# Patient Record
Sex: Male | Born: 1960 | Race: White | Hispanic: No | Smoking: Never smoker
Health system: Southern US, Community
[De-identification: ages and names within clinical notes are randomized; demographics above are authoritative.]

## PROBLEM LIST (undated history)

## (undated) DIAGNOSIS — E7800 Pure hypercholesterolemia, unspecified: Secondary | ICD-10-CM

## (undated) DIAGNOSIS — N200 Calculus of kidney: Secondary | ICD-10-CM

## (undated) DIAGNOSIS — Z973 Presence of spectacles and contact lenses: Secondary | ICD-10-CM

## (undated) DIAGNOSIS — Z87442 Personal history of urinary calculi: Secondary | ICD-10-CM

## (undated) HISTORY — PX: CLEFT PALATE REPAIR: SUR1165

## (undated) HISTORY — PX: MENISCUS REPAIR: SHX5179

## (undated) HISTORY — PX: ORCHIECTOMY: SHX2116

---

## 2010-05-10 DIAGNOSIS — N200 Calculus of kidney: Secondary | ICD-10-CM

## 2010-05-10 HISTORY — DX: Calculus of kidney: N20.0

## 2010-06-30 ENCOUNTER — Ambulatory Visit: Payer: Self-pay | Admitting: Internal Medicine

## 2010-12-17 ENCOUNTER — Ambulatory Visit: Payer: Self-pay

## 2010-12-24 ENCOUNTER — Ambulatory Visit: Payer: Self-pay | Admitting: Urology

## 2010-12-31 ENCOUNTER — Ambulatory Visit: Payer: Self-pay | Admitting: Urology

## 2011-01-14 ENCOUNTER — Ambulatory Visit: Payer: Self-pay | Admitting: Urology

## 2011-01-28 ENCOUNTER — Ambulatory Visit: Payer: Self-pay | Admitting: Urology

## 2011-03-02 ENCOUNTER — Ambulatory Visit: Payer: Self-pay | Admitting: Urology

## 2013-01-02 IMAGING — CR DG ABDOMEN 2V
1 series · 3 of 3 positions shown · non-contrast
Comparison: none

REASON FOR EXAM: pain
COMMENTS:

[Series 1: view not recorded · 0.17mm/px · 3 of 3 slices shown]
[im 1/3]
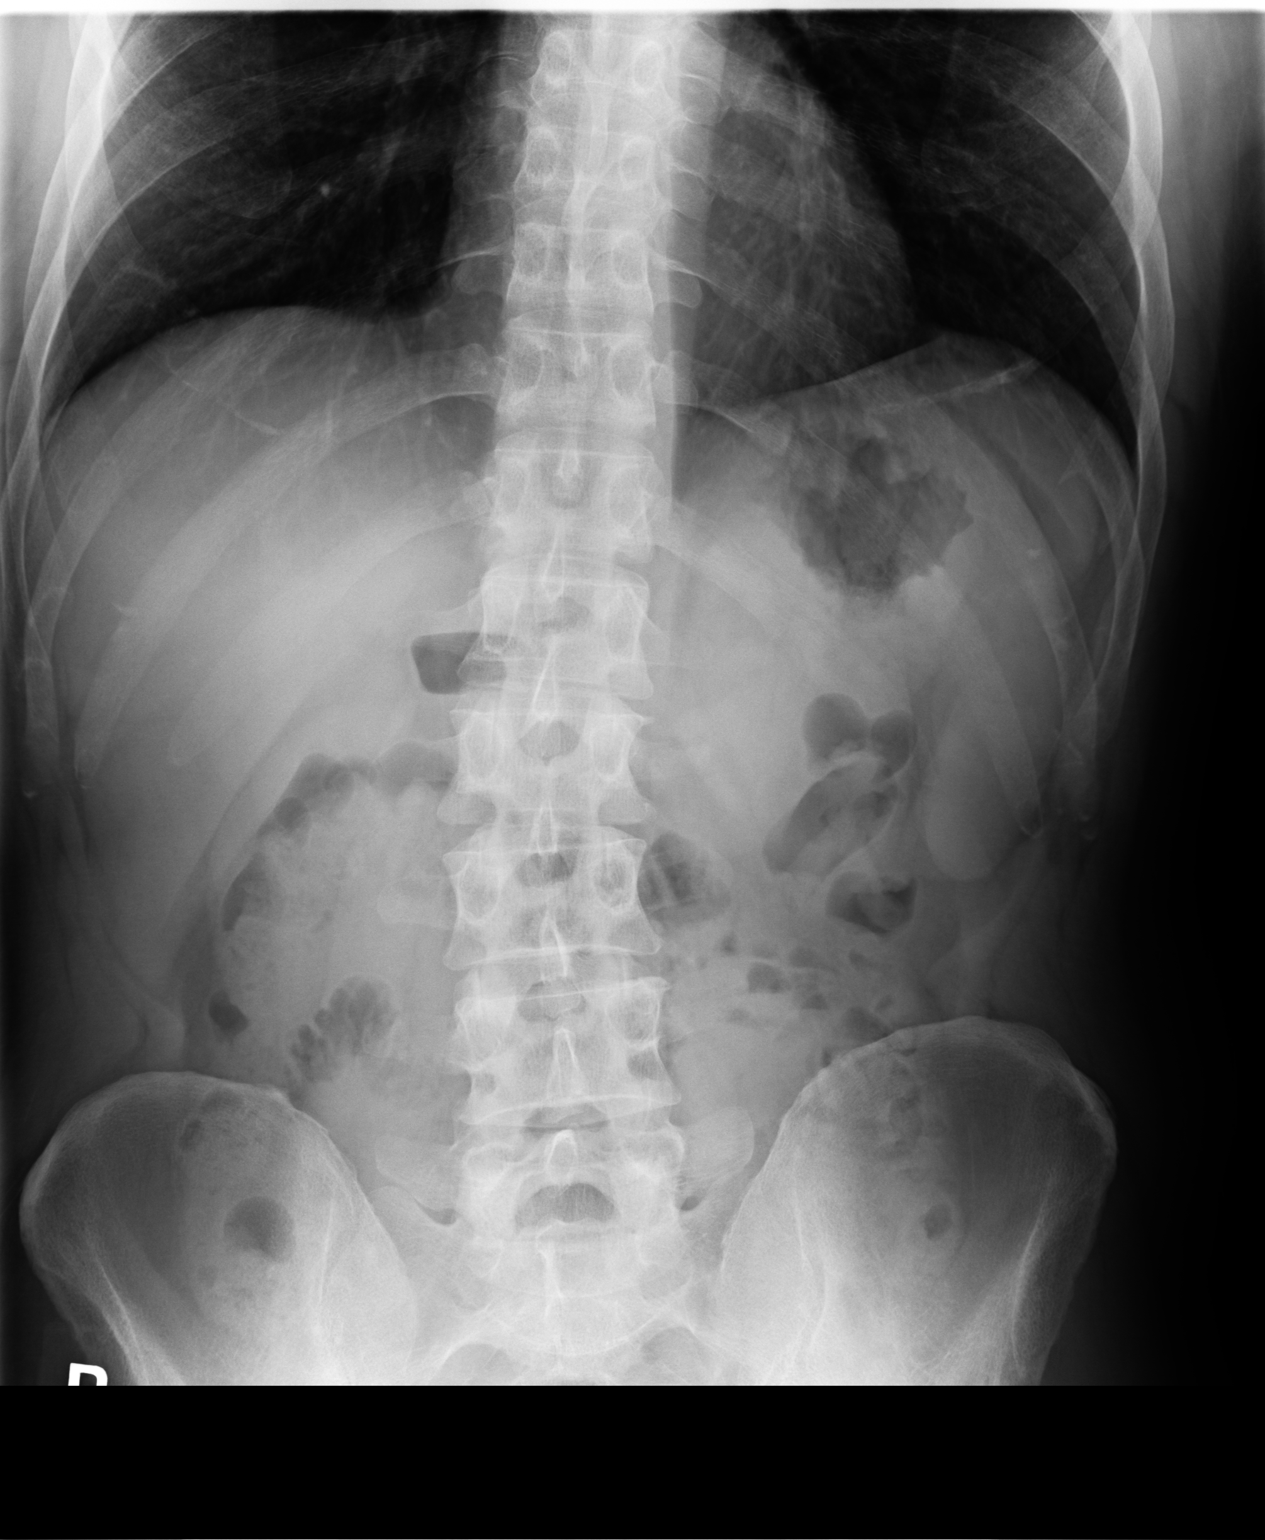
[im 2/3]
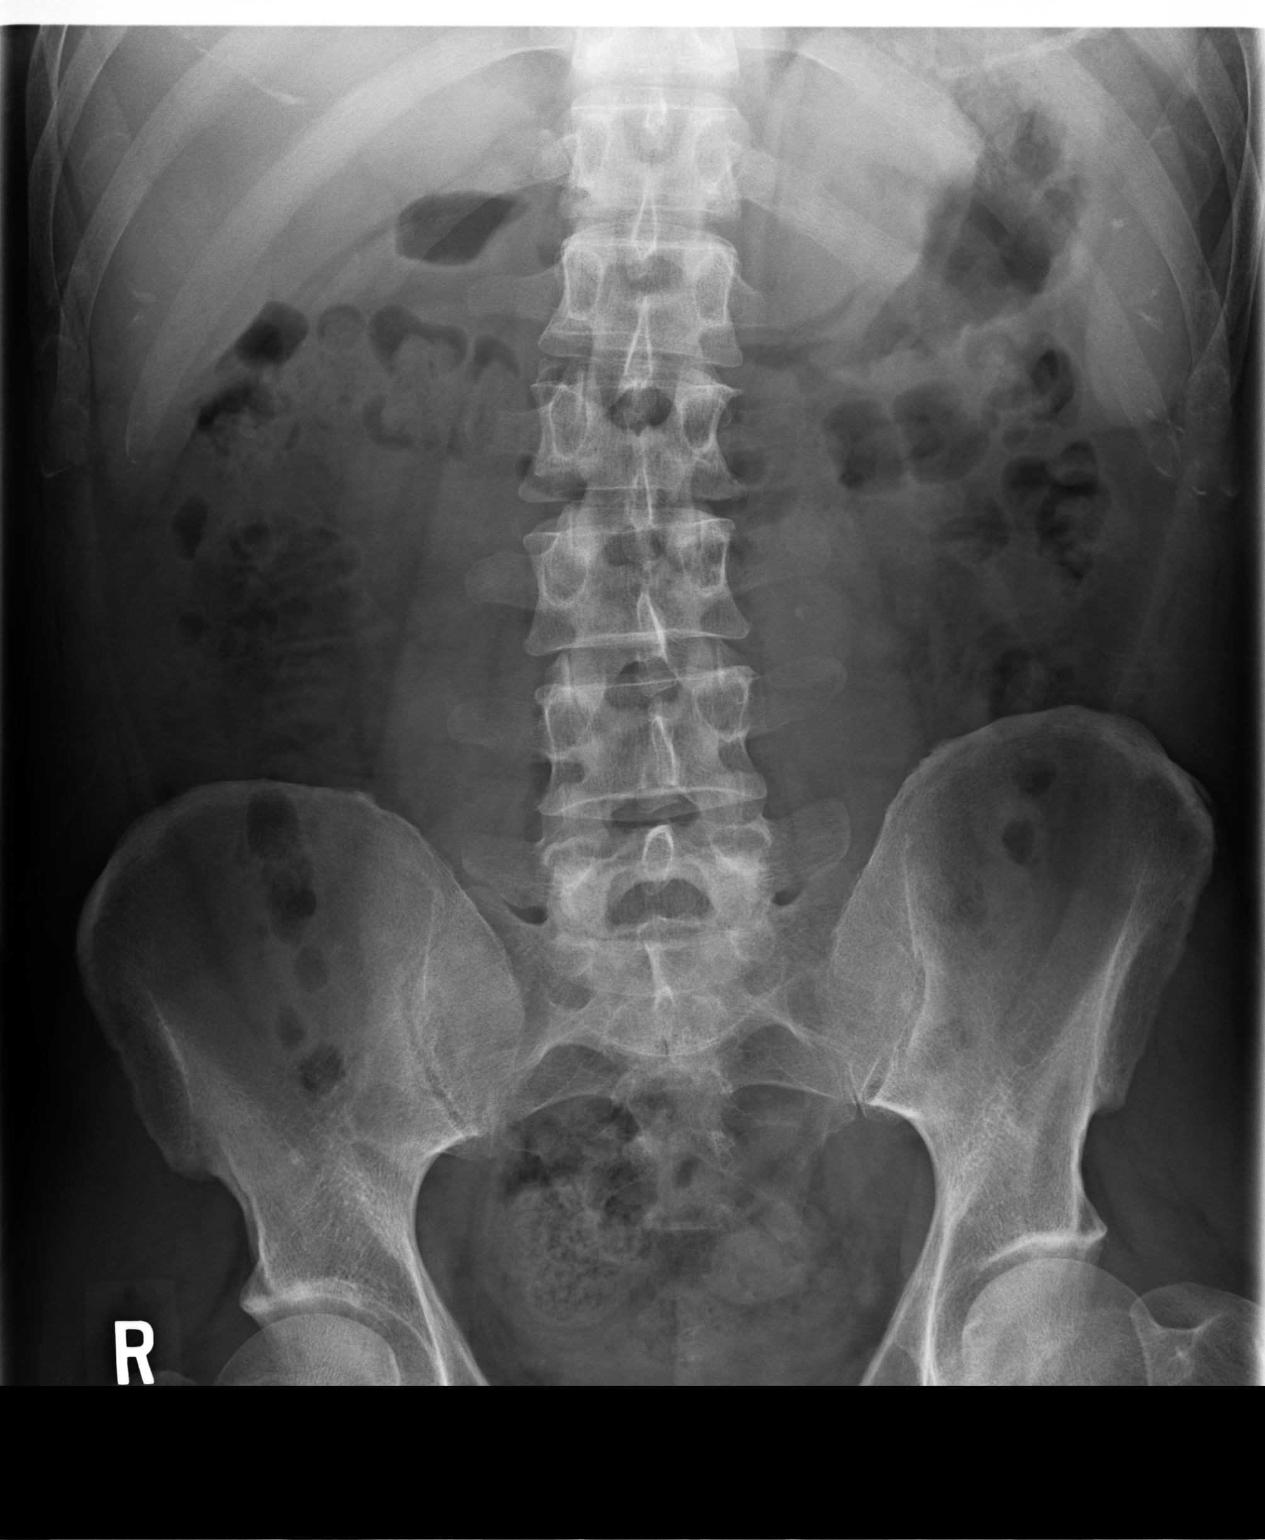
[im 3/3]
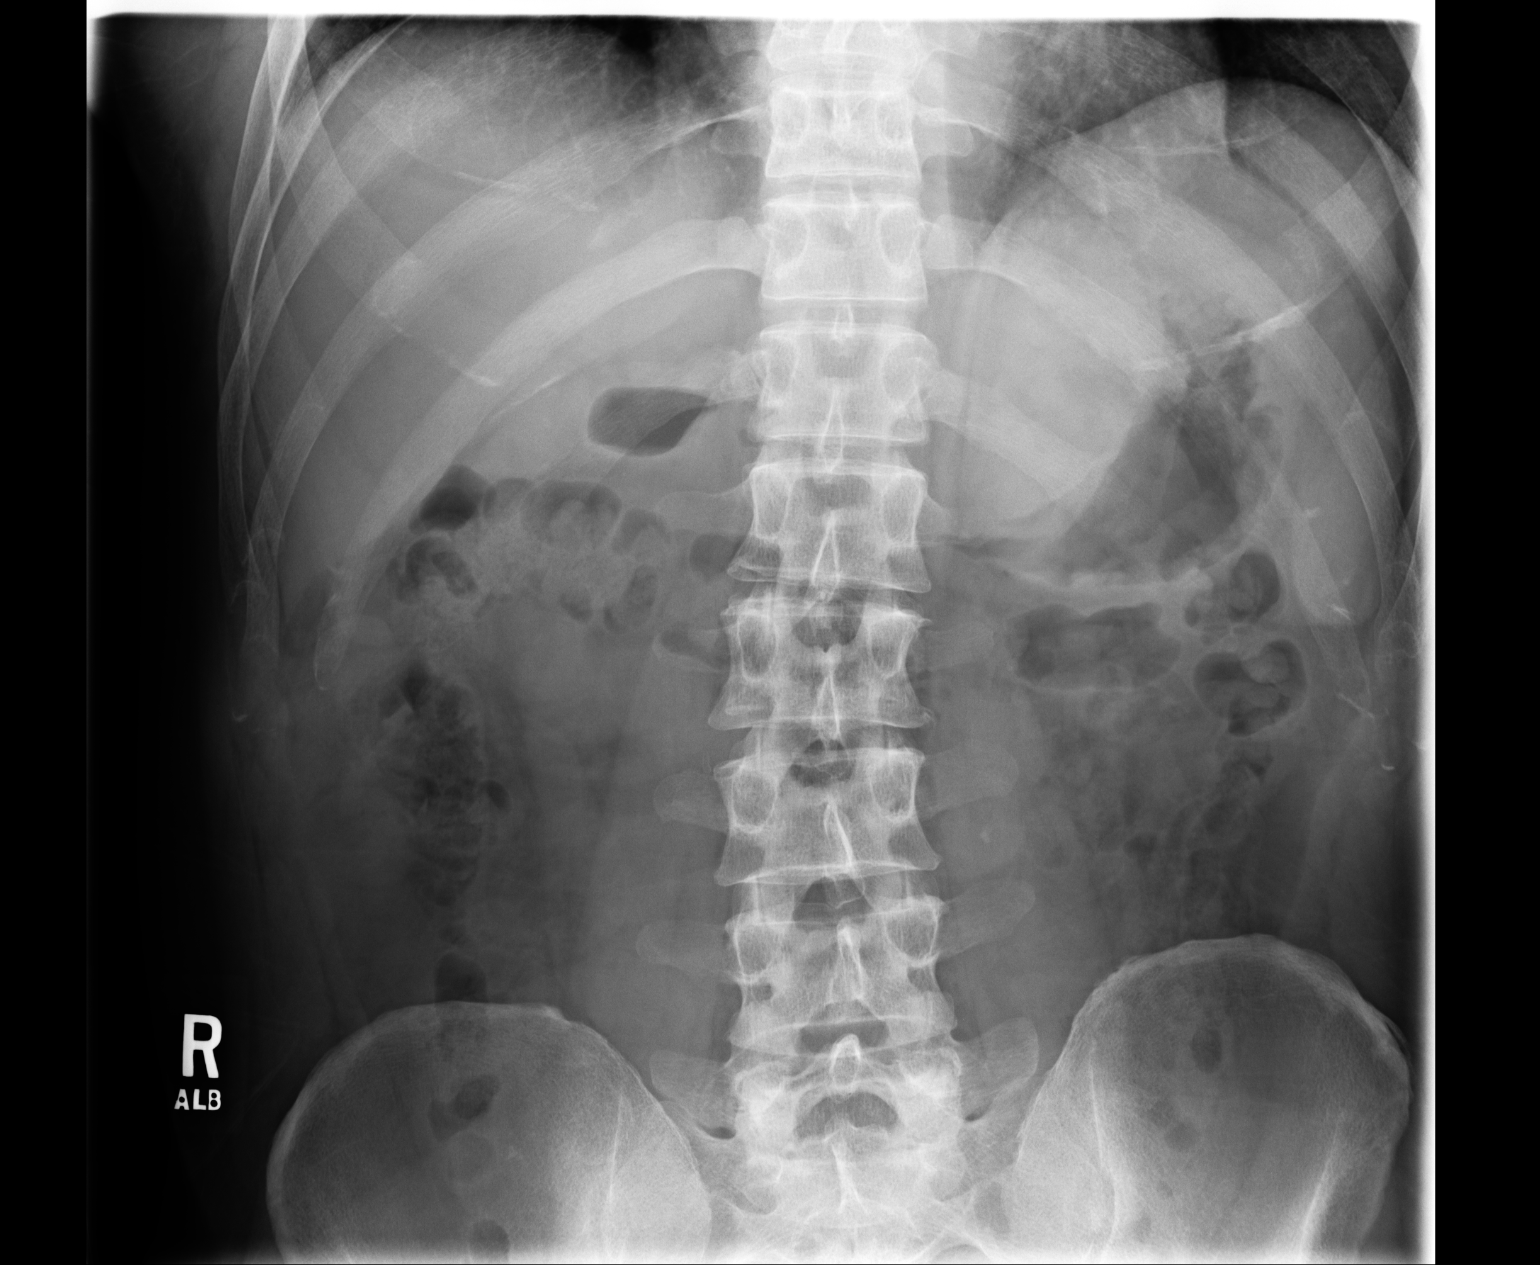

[3 of 3 positions shown; findings below may reference images not displayed]

PROCEDURE:     MDR - MDR ABDOMEN 2V FLAT AND ERECT  - December 17, 2010 [DATE]

RESULT:     The bowel gas pattern may indicate constipation. I do not see
evidence of ileus or obstruction. There is a faint radiodensity that
projects between the left transverse processes of L3 and L4 which may well
reflect a proximal left. There is calcification projecting of the left
aspect of the prostate gland. There is gentle curvature of the lumbar spine
with the convexity toward the right.
IMPRESSION: 1. There is a radiodensity measuring approximately 3 mm in greatest
dimension that projects along the proximal left ureter. This may reflect a
stone.
2. The bowel gas pattern may reflect constipation.

## 2013-01-09 IMAGING — CR DG ABDOMEN 1V
1 series · 2 of 2 positions shown · non-contrast
Comparison: none

REASON FOR EXAM: kidney stone
COMMENTS:

[Series 1: view not recorded · 0.17mm/px · 2 of 2 slices shown]
[im 1/2]
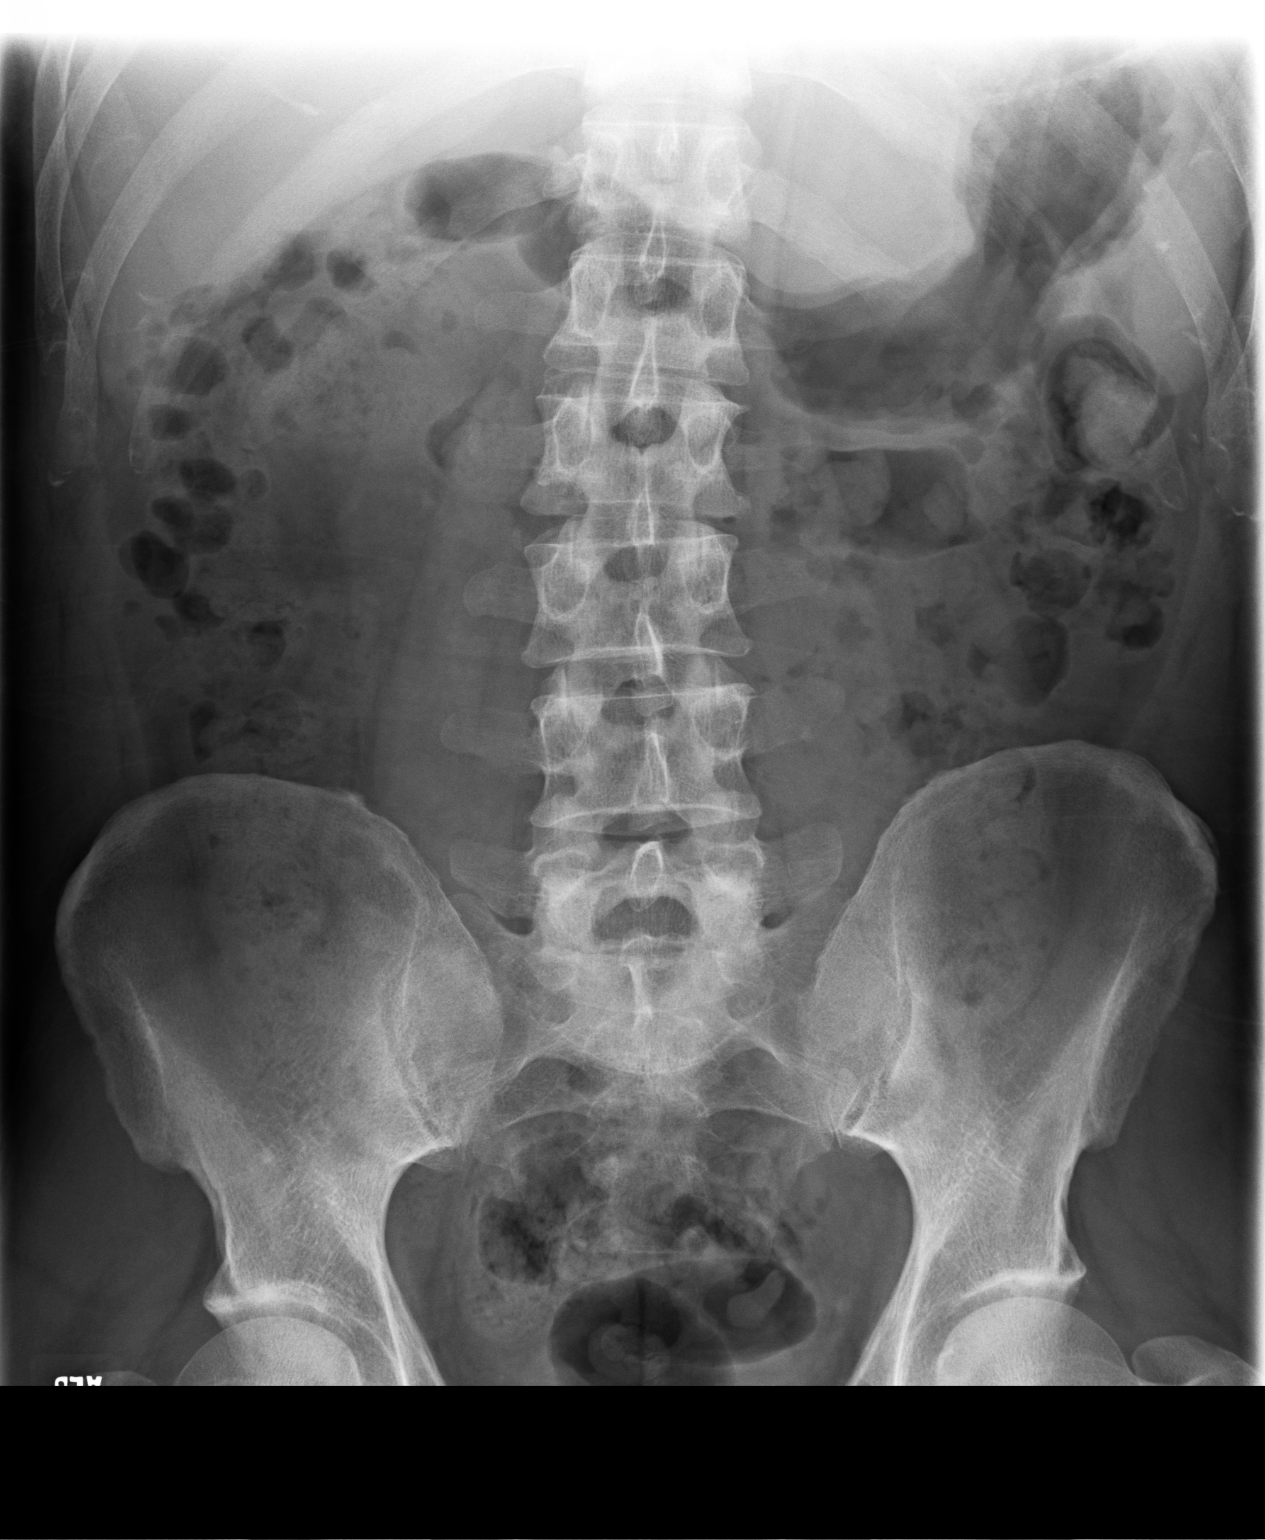
[im 2/2]
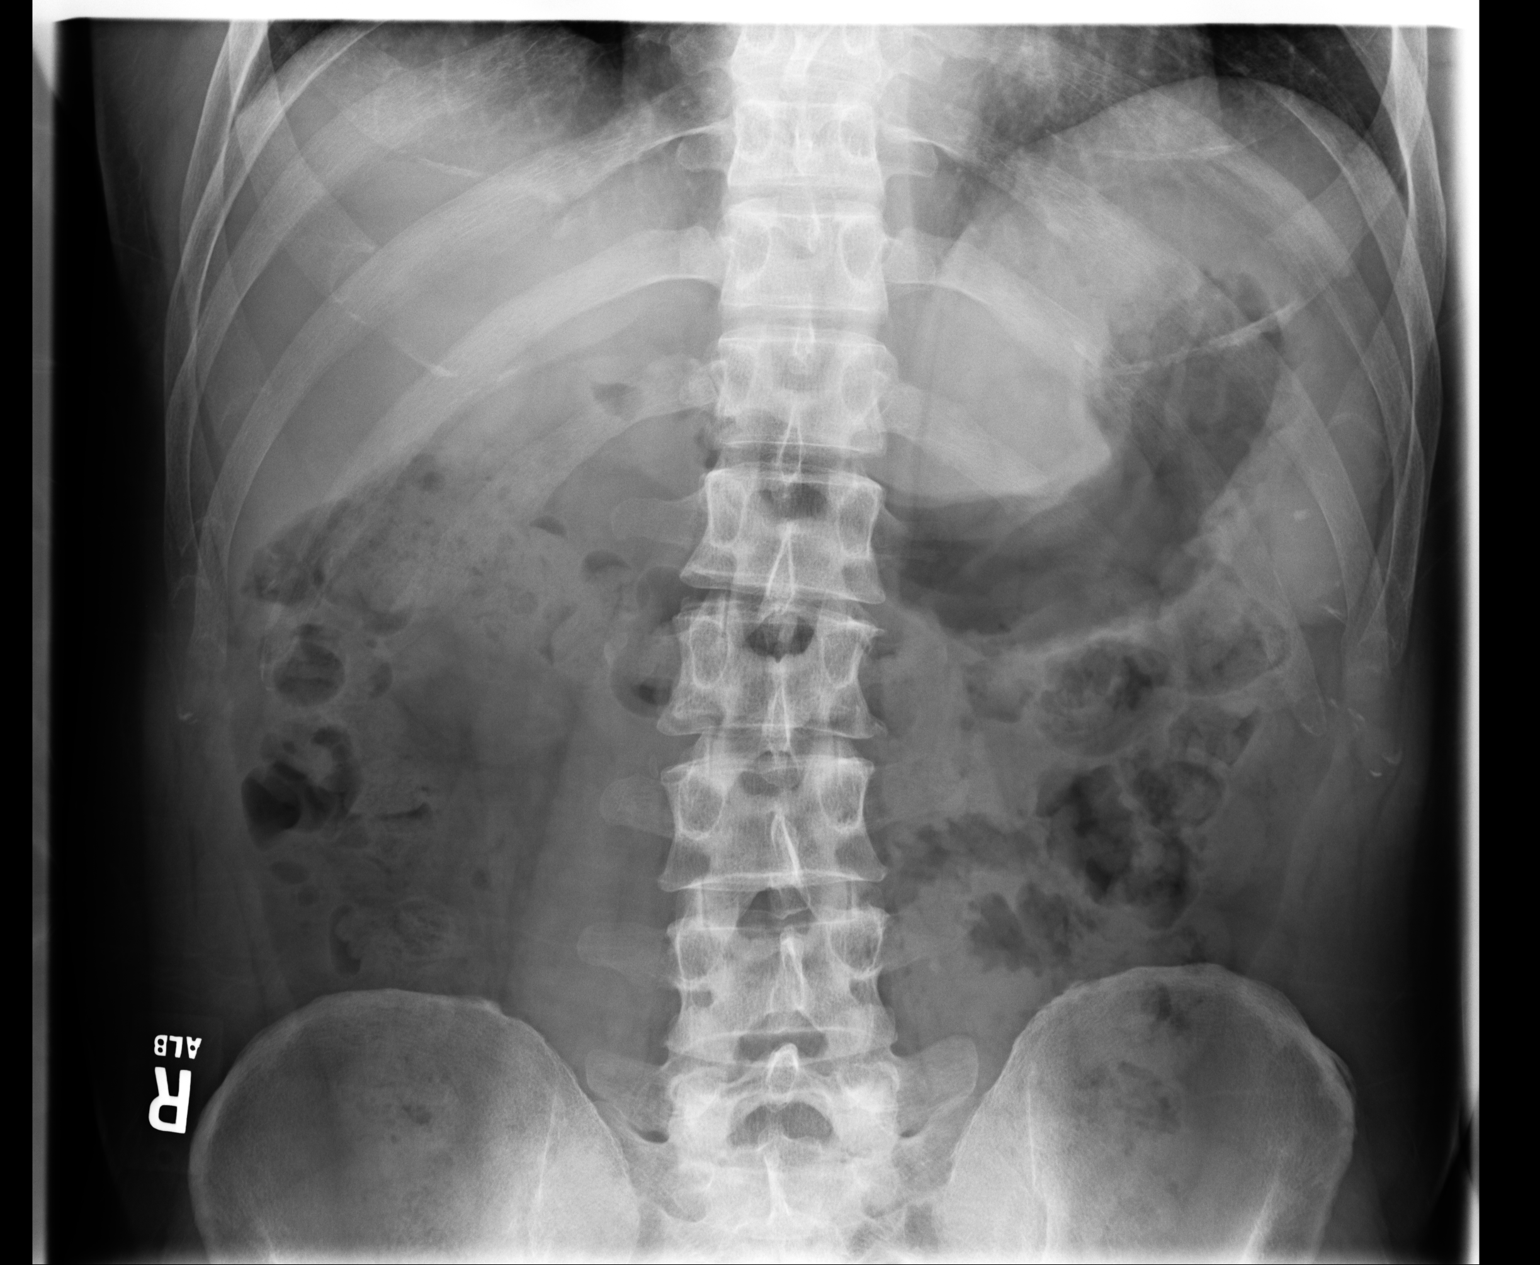

[2 of 2 positions shown; findings below may reference images not displayed]

PROCEDURE:     MDR - MDR KIDNEY URETER BLADDER  - December 24, 2010  [DATE]

RESULT:     Comparison is made to a prior exam of 12/17/2010.

The previously noted calcification inferior to the L3 lumbar transverse
process on the left is now projected over the inferior portion of the L4
lumbar spine transverse process. The finding is consistent with a left
ureteral stone that has moved inferiorly by a few centimeters. Incidental
note is made of a few calcifications in the region of the prostate gland. No
right renal or right ureteral calcifications are seen.
IMPRESSION: 1.  There persist changes consistent with a left ureteral stone that has now
moved inferiorly by a few centimeters.
2.  There is minimal calcification in the region of the prostate gland.

## 2013-01-16 IMAGING — CR DG ABDOMEN 1V
1 series · 2 of 2 positions shown · non-contrast
Comparison: none

REASON FOR EXAM: kidney stones - Send films with patient
COMMENTS:

[Series 1: view not recorded · 0.17mm/px · 2 of 2 slices shown]
[im 1/2]
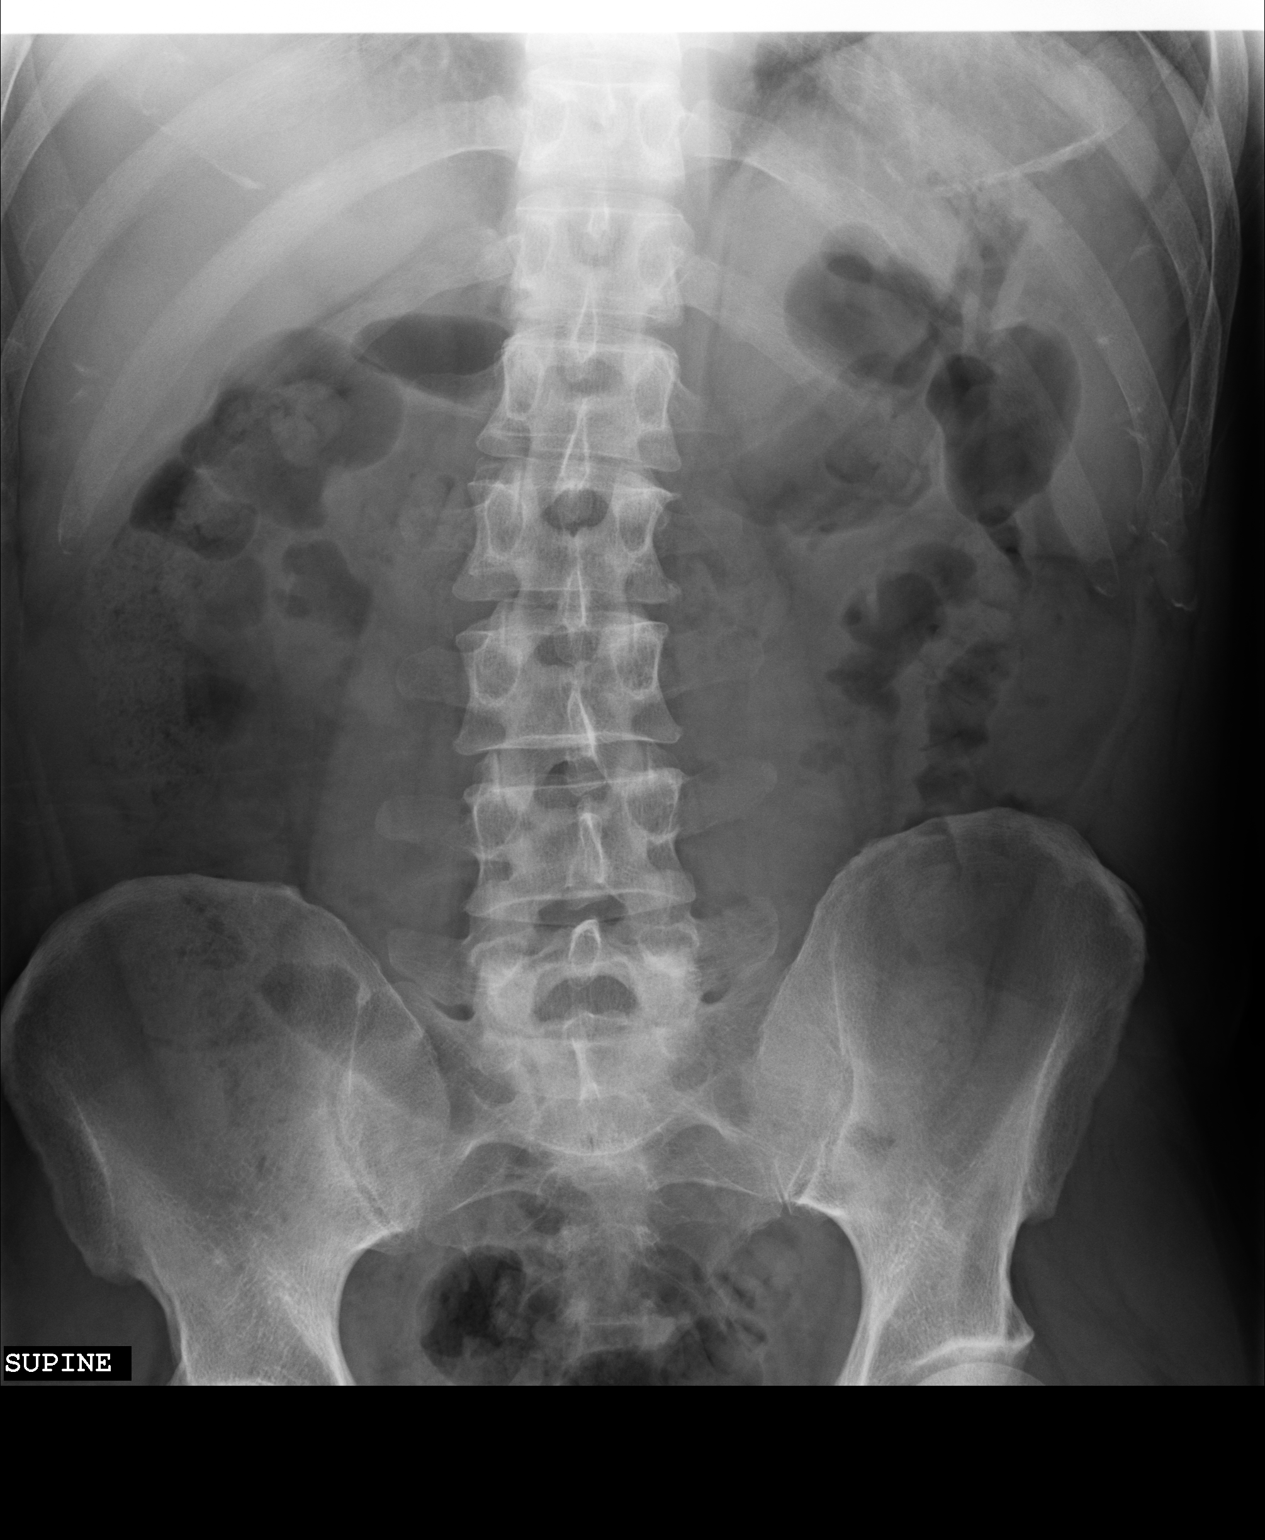
[im 2/2]
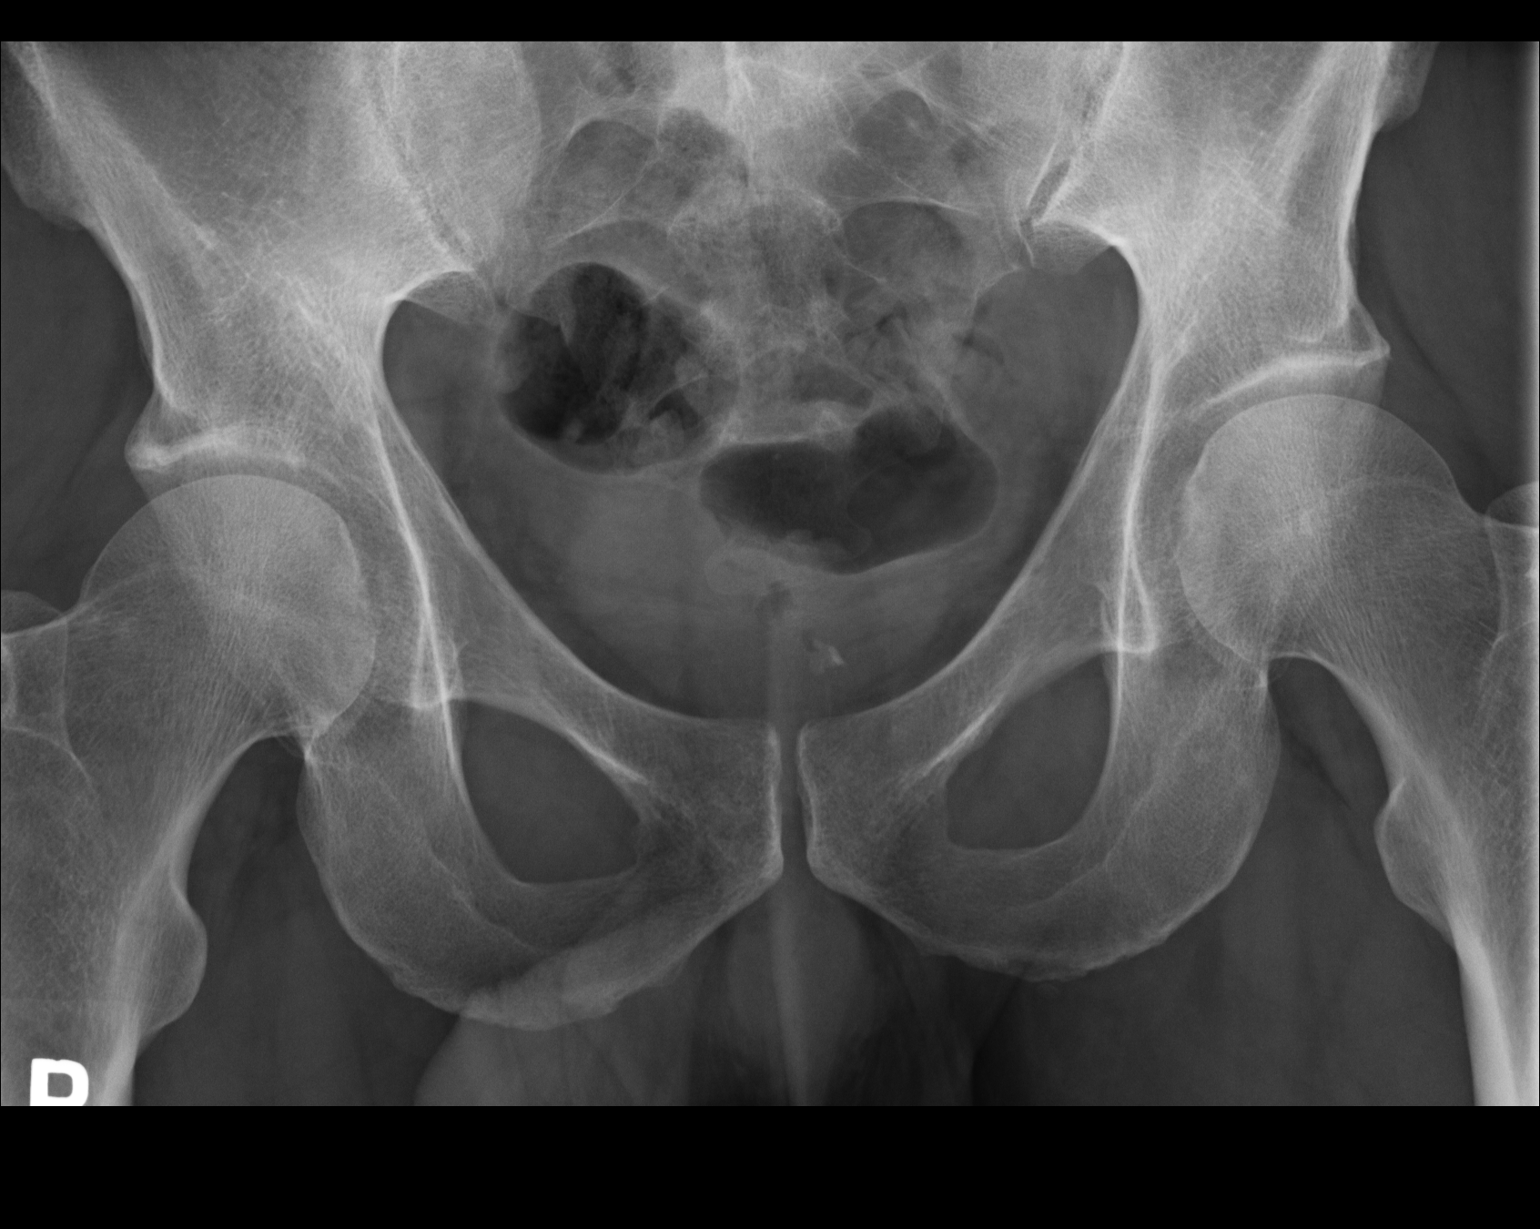

[2 of 2 positions shown; findings below may reference images not displayed]

PROCEDURE:     MDR - MDR KIDNEY URETER BLADDER  - December 31, 2010  [DATE]

RESULT:     Comparison is made to the prior exam of 12/24/2010.

The previously noted calcification projected at the level of the L4 lumbar
transverse process on the left is no longer seen and may have represented a
stone that has now passed or is now present in the pelvic area and obscured
by bowel and bowel content. No right renal or right ureteral stones are seen.
IMPRESSION: No definite renal or ureteral calcifications are identified.
The previously noted density projected over the L4 lumbar transverse process
on the left is no longer seen.

## 2013-05-22 ENCOUNTER — Ambulatory Visit: Payer: Self-pay | Admitting: Family Medicine

## 2014-04-13 ENCOUNTER — Ambulatory Visit: Payer: Self-pay | Admitting: Emergency Medicine

## 2014-09-14 ENCOUNTER — Ambulatory Visit
Admission: EM | Admit: 2014-09-14 | Discharge: 2014-09-14 | Disposition: A | Discharge status: Home / Self Care | Payer: BC Managed Care – PPO | Attending: Registered Nurse | Admitting: Registered Nurse

## 2014-09-14 DIAGNOSIS — S46912A Strain of unspecified muscle, fascia and tendon at shoulder and upper arm level, left arm, initial encounter: Secondary | ICD-10-CM

## 2014-09-14 DIAGNOSIS — N200 Calculus of kidney: Secondary | ICD-10-CM | POA: Insufficient documentation

## 2014-09-14 DIAGNOSIS — M6248 Contracture of muscle, other site: Secondary | ICD-10-CM

## 2014-09-14 DIAGNOSIS — M62838 Other muscle spasm: Secondary | ICD-10-CM

## 2014-09-14 HISTORY — DX: Presence of spectacles and contact lenses: Z97.3

## 2014-09-14 HISTORY — DX: Pure hypercholesterolemia, unspecified: E78.00

## 2014-09-14 HISTORY — DX: Calculus of kidney: N20.0

## 2014-09-14 MED ORDER — CYCLOBENZAPRINE HCL 5 MG PO TABS: 5.0000 mg | ORAL_TABLET | Freq: Three times a day (TID) | ORAL | Status: DC | PRN

## 2014-09-14 MED ORDER — NAPROXEN 500 MG PO TABS: 500.0000 mg | ORAL_TABLET | Freq: Two times a day (BID) | ORAL | Status: DC

## 2014-09-14 NOTE — ED Provider Notes (Signed)
CSN: [id number]     Arrival date & time 09/14/14  [id number] History   None    Chief Complaint  Patient presents with  . Shoulder Pain   (Consider location/radiation/quality/duration/timing/severity/associated sxs/prior Treatment) Patient is a 54 y.o. male presenting with shoulder pain. The history is provided by the patient.  Shoulder Pain Location:  Shoulder Time since incident:  1 month Injury: no   Shoulder location:  L shoulder Pain details:    Quality:  Aching, cramping, sharp and shooting   Radiates to:  R shoulder   Severity:  Moderate   Onset quality:  Gradual   Duration:  1 month   Timing:  Intermittent   Progression:  Worsening Chronicity:  Chronic Handedness:  Right-handed Dislocation: no   Tetanus status:  Up to date Prior injury to area:  No Relieved by:  Nothing Worsened by:  Movement Ineffective treatments:  NSAIDs, rest, being still, heat and ice Associated symptoms: decreased range of motion, neck pain and stiffness   Associated symptoms: no back pain, no fatigue, no fever, no muscle weakness, no numbness, no swelling and no tingling   Risk factors: no concern for non-accidental trauma, no known bone disorder, no frequent fractures and no recent illness   Has had intermittent neck and shoulder pain over past 15 years.  Flare currently over past month thinks he needs muscle relaxants again last took over 10 years ago flexeril without problems.  Has been trying aleve OTC 200mg  po BID, heat, ice, rest, stretching without any relief.  Feels knots in neck and left shoulder blade and with muscle spasms pain radiates to right posterior shoulder/neck/upper back.  Past Medical History  Diagnosis Date  . Hypercholesteremia   . Wears glasses   . Kidney stone 2012   Past Surgical History  Procedure Laterality Date  . Meniscus repair Right   . Cleft palate repair Left infant  . Orchiectomy Right    Family History  Problem Relation Age of Onset  . Ulcers Mother   .  Cancer Mother   . Emphysema Father    History  Substance Use Topics  . Smoking status: Never Smoker   . Smokeless tobacco: Not on file  . Alcohol Use: Yes     Comment: socially once monthly    Review of Systems  Constitutional: Negative.  Negative for fever and fatigue.  HENT: Negative.   Eyes: Negative.   Cardiovascular: Negative.   Gastrointestinal: Negative.   Genitourinary: Negative.   Musculoskeletal: Positive for myalgias, arthralgias, stiffness and neck pain. Negative for back pain, gait problem and neck stiffness.  Skin: Negative.   Allergic/Immunologic: Negative.   Neurological: Negative.   Hematological: Negative.   Psychiatric/Behavioral: Negative.     Allergies  Review of patient's allergies indicates no known allergies.  Home Medications   Prior to Admission medications   Medication Sig Start Date End Date Taking? Authorizing Provider  atorvastatin (LIPITOR) 40 MG tablet Take 40 mg by mouth daily.   Yes Historical Provider, MD  cyclobenzaprine (FLEXERIL) 5 MG tablet Take 1 tablet (5 mg total) by mouth 3 (three) times daily as needed for muscle spasms. 09/14/14   Barbaraann Barthelina A Betancourt, NP  naproxen (NAPROSYN) 500 MG tablet Take 1 tablet (500 mg total) by mouth 2 (two) times daily. 09/14/14   Barbaraann Barthelina A Betancourt, NP   BP 145/88 mmHg  Pulse 88  Temp(Src) 96.7 F (35.9 C) (Tympanic)  Resp 18  Ht 5\' 6"  (1.676 m)  Wt 160 lb (72.576 kg)  BMI 25.84 kg/m2  SpO2 99% Physical Exam  Constitutional: He is oriented to person, place, and time. Vital signs are normal. He appears well-developed and well-nourished. No distress.  HENT:  Head: Normocephalic and atraumatic.  Right Ear: External ear normal.  Left Ear: External ear normal.  Mouth/Throat: Oropharynx is clear and moist.  Eyes: Conjunctivae, EOM and lids are normal. Pupils are equal, round, and reactive to light.  Neck: Trachea normal and normal range of motion. Neck supple. No JVD present. No tracheal deviation  present. No thyromegaly present.  Cardiovascular: Normal rate, regular rhythm and intact distal pulses.   Pulmonary/Chest: Effort normal and breath sounds normal.  Abdominal: Soft.  Musculoskeletal: Normal range of motion. He exhibits tenderness. He exhibits no edema.       Right shoulder: He exhibits tenderness and pain. He exhibits normal range of motion, no bony tenderness, no swelling, no effusion, no crepitus, no deformity, no laceration, no spasm, normal pulse and normal strength.       Left shoulder: He exhibits tenderness, pain and spasm. He exhibits normal range of motion, no bony tenderness, no swelling, no effusion, no crepitus, no deformity, no laceration, normal pulse and normal strength.  Pain with abduction and adduction from shoulder level to 180 degrees; atchley scratch bilaterally; spasms left scapular ridge with extension hands above shoulder level; TTP c-spine all paraspinals/trapezius bilaterally tight and left scapular ridge; c-spine AROM lateral bending decreased full AROM extension/flexion/rotation but painful at end AROM  Lymphadenopathy:    He has no cervical adenopathy.  Neurological: He is alert and oriented to person, place, and time. He has normal reflexes. He displays normal reflexes. No cranial nerve deficit. Coordination normal.  Skin: Skin is warm, dry and intact. No rash noted. He is not diaphoretic. No erythema. No pallor.  Psychiatric: He has a normal mood and affect. His speech is normal and behavior is normal. Judgment and thought content normal. Cognition and memory are normal.    ED Course  Procedures (including critical care time) Labs Review Labs Reviewed - No data to display  Imaging Review No results found. For acute pain, rest, and intermittent application of heat (do not sleep on heating pad).  I discussed longer term treatment plan of PRN NSAIDS and I discussed a home shoulder and neck care exercise program with a flexion/extension exercise  routine of c-spine and bilateral shoulders e.g. Walking hand up wall, shoulder circles, chin tucks, trapezius stretches with hand towel, AROM c-spine flexion/extension/lateral bending and rotation, chest stretch in doorway at least BID.  Proper avoidance of heavy lifting discussed.  Consider physical therapy and additional radiology if not improving with PCM in 2 weeks if naproxen 500mg  po BID x 2 weeks, stretching, heat, ice, biofreeze, massage, adjusting computer monitor height, having coworkers assist with animal lifting at work. [facility] or [facility] to clinic as needed if these symptoms worsen or fail to improve as anticipated.   Patient verbalized agreement and understanding of treatment plan. P2:  Injury Prevention, fitness  MDM   1. Muscle spasms of neck   2. Muscle strain of scapular region, left, initial encounter        Barbaraann Barthelina A Betancourt, NP 09/14/14 1059  Barbaraann Barthelina A Betancourt, NP 09/14/14 1100

## 2014-09-14 NOTE — Discharge Instructions (Signed)
Cervical Strain and Sprain (Whiplash) with Rehab Cervical strain and sprain are injuries that commonly occur with "whiplash" injuries. Whiplash occurs when the neck is forcefully whipped backward or forward, such as during a motor vehicle accident or during contact sports. The muscles, ligaments, tendons, discs, and nerves of the neck are susceptible to injury when this occurs. RISK FACTORS Risk of having a whiplash injury increases if:  Osteoarthritis of the spine.  Situations that make head or neck accidents or trauma more likely.  High-risk sports (football, rugby, wrestling, hockey, auto racing, gymnastics, diving, contact karate, or boxing).  Poor strength and flexibility of the neck.  Previous neck injury.  Poor tackling technique.  Improperly fitted or padded equipment. SYMPTOMS   Pain or stiffness in the front or back of neck or both.  Symptoms may present immediately or up to 24 hours after injury.  Dizziness, headache, nausea, and vomiting.  Muscle spasm with soreness and stiffness in the neck.  Tenderness and swelling at the injury site. PREVENTION  Learn and use proper technique (avoid tackling with the head, spearing, and head-butting; use proper falling techniques to avoid landing on the head).  Warm up and stretch properly before activity.  Maintain physical fitness:  Strength, flexibility, and endurance.  Cardiovascular fitness.  Wear properly fitted and padded protective equipment, such as padded soft collars, for participation in contact sports. PROGNOSIS  Recovery from cervical strain and sprain injuries is dependent on the extent of the injury. These injuries are usually curable in 1 week to 3 months with appropriate treatment.  RELATED COMPLICATIONS   Temporary numbness and weakness may occur if the nerve roots are damaged, and this may persist until the nerve has completely healed.  Chronic pain due to frequent recurrence of  symptoms.  Prolonged healing, especially if activity is resumed too soon (before complete recovery). TREATMENT  Treatment initially involves the use of ice and medication to help reduce pain and inflammation. It is also important to perform strengthening and stretching exercises and modify activities that worsen symptoms so the injury does not get worse. These exercises may be performed at home or with a therapist. For patients who experience severe symptoms, a soft, padded collar may be recommended to be worn around the neck.  Improving your posture may help reduce symptoms. Posture improvement includes pulling your chin and abdomen in while sitting or standing. If you are sitting, sit in a firm chair with your buttocks against the back of the chair. While sleeping, try replacing your pillow with a small towel rolled to 2 inches in diameter, or use a cervical pillow or soft cervical collar. Poor sleeping positions delay healing.  For patients with nerve root damage, which causes numbness or weakness, the use of a cervical traction apparatus may be recommended. Surgery is rarely necessary for these injuries. However, cervical strain and sprains that are present at birth (congenital) may require surgery. MEDICATION   If pain medication is necessary, nonsteroidal anti-inflammatory medications, such as aspirin and ibuprofen, or other minor pain relievers, such as acetaminophen, are often recommended.  Do not take pain medication for 7 days before surgery.  Prescription pain relievers may be given if deemed necessary by your caregiver. Use only as directed and only as much as you need. HEAT AND COLD:   Cold treatment (icing) relieves pain and reduces inflammation. Cold treatment should be applied for 10 to 15 minutes every 2 to 3 hours for inflammation and pain and immediately after any activity that aggravates  your symptoms. Use ice packs or an ice massage. °· Heat treatment may be used prior to  performing the stretching and strengthening activities prescribed by your caregiver, physical therapist, or athletic trainer. Use a heat pack or a warm soak. °SEEK MEDICAL CARE IF:  °· Symptoms get worse or do not improve in 2 weeks despite treatment. °· New, unexplained symptoms develop (drugs used in treatment may produce side effects). °EXERCISES °RANGE OF MOTION (ROM) AND STRETCHING EXERCISES - Cervical Strain and Sprain °These exercises may help you when beginning to rehabilitate your injury. In order to successfully resolve your symptoms, you must improve your posture. These exercises are designed to help reduce the forward-head and rounded-shoulder posture which contributes to this condition. Your symptoms may resolve with or without further involvement from your physician, physical therapist or athletic trainer. While completing these exercises, remember:  °· Restoring tissue flexibility helps normal motion to return to the joints. This allows healthier, less painful movement and activity. °· An effective stretch should be held for at least 20 seconds, although you may need to begin with shorter hold times for comfort. °· A stretch should never be painful. You should only feel a gentle lengthening or release in the stretched tissue. °STRETCH- Axial Extensors °· Lie on your back on the floor. You may bend your knees for comfort. Place a rolled-up hand towel or dish towel, about 2 inches in diameter, under the part of your head that makes contact with the floor. °· Gently tuck your chin, as if trying to make a "double chin," until you feel a gentle stretch at the base of your head. °· Hold __________ seconds. °Repeat __________ times. Complete this exercise __________ times per day.  °STRETCH - Axial Extension  °· Stand or sit on a firm surface. Assume a good posture: chest up, shoulders drawn back, abdominal muscles slightly tense, knees unlocked (if standing) and feet hip width apart. °· Slowly retract your  chin so your head slides back and your chin slightly lowers. Continue to look straight ahead. °· You should feel a gentle stretch in the back of your head. Be certain not to feel an aggressive stretch since this can cause headaches later. °· Hold for __________ seconds. °Repeat __________ times. Complete this exercise __________ times per day. °STRETCH - Cervical Side Bend  °· Stand or sit on a firm surface. Assume a good posture: chest up, shoulders drawn back, abdominal muscles slightly tense, knees unlocked (if standing) and feet hip width apart. °· Without letting your nose or shoulders move, slowly tip your right / left ear to your shoulder until your feel a gentle stretch in the muscles on the opposite side of your neck. °· Hold __________ seconds. °Repeat __________ times. Complete this exercise __________ times per day. °STRETCH - Cervical Rotators  °· Stand or sit on a firm surface. Assume a good posture: chest up, shoulders drawn back, abdominal muscles slightly tense, knees unlocked (if standing) and feet hip width apart. °· Keeping your eyes level with the ground, slowly turn your head until you feel a gentle stretch along the back and opposite side of your neck. °· Hold __________ seconds. °Repeat __________ times. Complete this exercise __________ times per day. °RANGE OF MOTION - Neck Circles  °· Stand or sit on a firm surface. Assume a good posture: chest up, shoulders drawn back, abdominal muscles slightly tense, knees unlocked (if standing) and feet hip width apart. °· Gently roll your head down and around from the   back of one shoulder to the back of the other. The motion should never be forced or painful.  Repeat the motion 10-20 times, or until you feel the neck muscles relax and loosen. Repeat __________ times. Complete the exercise __________ times per day. STRENGTHENING EXERCISES - Cervical Strain and Sprain These exercises may help you when beginning to rehabilitate your injury. They may  resolve your symptoms with or without further involvement from your physician, physical therapist, or athletic trainer. While completing these exercises, remember:   Muscles can gain both the endurance and the strength needed for everyday activities through controlled exercises.  Complete these exercises as instructed by your physician, physical therapist, or athletic trainer. Progress the resistance and repetitions only as guided.  You may experience muscle soreness or fatigue, but the pain or discomfort you are trying to eliminate should never worsen during these exercises. If this pain does worsen, stop and make certain you are following the directions exactly. If the pain is still present after adjustments, discontinue the exercise until you can discuss the trouble with your clinician. STRENGTH - Cervical Flexors, Isometric  Face a wall, standing about 6 inches away. Place a small pillow, a ball about 6-8 inches in diameter, or a folded towel between your forehead and the wall.  Slightly tuck your chin and gently push your forehead into the soft object. Push only with mild to moderate intensity, building up tension gradually. Keep your jaw and forehead relaxed.  Hold 10 to 20 seconds. Keep your breathing relaxed.  Release the tension slowly. Relax your neck muscles completely before you start the next repetition. Repeat __________ times. Complete this exercise __________ times per day. STRENGTH- Cervical Lateral Flexors, Isometric   Stand about 6 inches away from a wall. Place a small pillow, a ball about 6-8 inches in diameter, or a folded towel between the side of your head and the wall.  Slightly tuck your chin and gently tilt your head into the soft object. Push only with mild to moderate intensity, building up tension gradually. Keep your jaw and forehead relaxed.  Hold 10 to 20 seconds. Keep your breathing relaxed.  Release the tension slowly. Relax your neck muscles completely  before you start the next repetition. Repeat __________ times. Complete this exercise __________ times per day. STRENGTH - Cervical Extensors, Isometric   Stand about 6 inches away from a wall. Place a small pillow, a ball about 6-8 inches in diameter, or a folded towel between the back of your head and the wall.  Slightly tuck your chin and gently tilt your head back into the soft object. Push only with mild to moderate intensity, building up tension gradually. Keep your jaw and forehead relaxed.  Hold 10 to 20 seconds. Keep your breathing relaxed.  Release the tension slowly. Relax your neck muscles completely before you start the next repetition. Repeat __________ times. Complete this exercise __________ times per day. POSTURE AND BODY MECHANICS CONSIDERATIONS - Cervical Strain and Sprain Keeping correct posture when sitting, standing or completing your activities will reduce the stress put on different body tissues, allowing injured tissues a chance to heal and limiting painful experiences. The following are general guidelines for improved posture. Your physician or physical therapist will provide you with any instructions specific to your needs. While reading these guidelines, remember:  The exercises prescribed by your provider will help you have the flexibility and strength to maintain correct postures.  The correct posture provides the optimal environment for your joints to  work. All of your joints have less wear and tear when properly supported by a spine with good posture. This means you will experience a healthier, less painful body.  Correct posture must be practiced with all of your activities, especially prolonged sitting and standing. Correct posture is as important when doing repetitive low-stress activities (typing) as it is when doing a single heavy-load activity (lifting). PROLONGED STANDING WHILE SLIGHTLY LEANING FORWARD When completing a task that requires you to lean  forward while standing in one place for a long time, place either foot up on a stationary 2- to 4-inch high object to help maintain the best posture. When both feet are on the ground, the low back tends to lose its slight inward curve. If this curve flattens (or becomes too large), then the back and your other joints will experience too much stress, fatigue more quickly, and can cause pain.  RESTING POSITIONS Consider which positions are most painful for you when choosing a resting position. If you have pain with flexion-based activities (sitting, bending, stooping, squatting), choose a position that allows you to rest in a less flexed posture. You would want to avoid curling into a fetal position on your side. If your pain worsens with extension-based activities (prolonged standing, working overhead), avoid resting in an extended position such as sleeping on your stomach. Most people will find more comfort when they rest with their spine in a more neutral position, neither too rounded nor too arched. Lying on a non-sagging bed on your side with a pillow between your knees, or on your back with a pillow under your knees will often provide some relief. Keep in mind, being in any one position for a prolonged period of time, no matter how correct your posture, can still lead to stiffness. WALKING Walk with an upright posture. Your ears, shoulders, and hips should all line up. OFFICE WORK When working at a desk, create an environment that supports good, upright posture. Without extra support, muscles fatigue and lead to excessive strain on joints and other tissues. CHAIR:  A chair should be able to slide under your desk when your back makes contact with the back of the chair. This allows you to work closely.  The chair's height should allow your eyes to be level with the upper part of your monitor and your hands to be slightly lower than your elbows.  Body position:  Your feet should make contact with the  floor. If this is not possible, use a foot rest.  Keep your ears over your shoulders. This will reduce stress on your neck and low back. Document Released: 04/26/2005 Document Revised: 09/10/2013 Document Reviewed: 08/08/2008 [facility] Patient Information 2015 [place]Care, [place]. This information is not intended to replace advice given to you by your health care provider. Make sure you discuss any questions you have with your health care provider. Muscle Cramps and Spasms Muscle cramps and spasms occur when a muscle or muscles tighten and you have no control over this tightening (involuntary muscle contraction). They are a common problem and can develop in any muscle. The most common place is in the calf muscles of the leg. Both muscle cramps and muscle spasms are involuntary muscle contractions, but they also have differences:   Muscle cramps are sporadic and painful. They may last a few seconds to a quarter of an hour. Muscle cramps are often more forceful and last longer than muscle spasms.  Muscle spasms may or may not be painful. They may also  last just a few seconds or much longer. CAUSES  It is uncommon for cramps or spasms to be due to a serious underlying problem. In many cases, the cause of cramps or spasms is unknown. Some common causes are:   Overexertion.   Overuse from repetitive motions (doing the same thing over and over).   Remaining in a certain position for a long period of time.   Improper preparation, form, or technique while performing a sport or activity.   Dehydration.   Injury.   Side effects of some medicines.   Abnormally low levels of the salts and ions in your blood (electrolytes), especially potassium and calcium. This could happen if you are taking water pills (diuretics) or you are pregnant.  Some underlying medical problems can make it more likely to develop cramps or spasms. These include, but are not limited to:   Diabetes.   Parkinson disease.    Hormone disorders, such as thyroid problems.   Alcohol abuse.   Diseases specific to muscles, joints, and bones.   Blood vessel disease where not enough blood is getting to the muscles.  HOME CARE INSTRUCTIONS   Stay well hydrated. Drink enough water and fluids to keep your urine clear or pale yellow.  It may be helpful to massage, stretch, and relax the affected muscle.  For tight or tense muscles, use a warm towel, heating pad, or hot shower water directed to the affected area.  If you are sore or have pain after a cramp or spasm, applying ice to the affected area may relieve discomfort.  Put ice in a plastic bag.  Place a towel between your skin and the bag.  Leave the ice on for 15-20 minutes, 03-04 times a day.  Medicines used to treat a known cause of cramps or spasms may help reduce their frequency or severity. Only take over-the-counter or prescription medicines as directed by your caregiver. SEEK MEDICAL CARE IF:  Your cramps or spasms get more severe, more frequent, or do not improve over time.  MAKE SURE YOU:   Understand these instructions.  Will watch your condition.  Will get help right away if you are not doing well or get worse. Document Released: 10/16/2001 Document Revised: 08/21/2012 Document Reviewed: 04/12/2012 [facility] Patient Information 2015 [place], [place]. This information is not intended to replace advice given to you by your health care provider. Make sure you discuss any questions you have with your health care provider.

## 2014-09-14 NOTE — ED Notes (Signed)
States has had left posterior scapular pain x 1 month radiating over top of shoulder. Denies Chest Pain, SOB or diaphoretic

## 2017-07-27 ENCOUNTER — Other Ambulatory Visit: Payer: Self-pay

## 2017-07-27 ENCOUNTER — Encounter: Payer: Self-pay | Admitting: Emergency Medicine

## 2017-07-27 ENCOUNTER — Ambulatory Visit
Admission: EM | Admit: 2017-07-27 | Discharge: 2017-07-27 | Disposition: A | Discharge status: Home / Self Care | Payer: BC Managed Care – PPO | Attending: Family Medicine | Admitting: Family Medicine

## 2017-07-27 DIAGNOSIS — R69 Illness, unspecified: Secondary | ICD-10-CM | POA: Diagnosis not present

## 2017-07-27 DIAGNOSIS — J111 Influenza due to unidentified influenza virus with other respiratory manifestations: Secondary | ICD-10-CM

## 2017-07-27 NOTE — ED Provider Notes (Signed)
[facility] ____________________________________________  Time seen: Approximately  0930 AM  I have reviewed the triage vital signs and the nursing notes.   HISTORY  Chief Complaint Generalized Body Aches   HPI Kenly Xiao Brandan Robicheaux. is a 57 y.o. male presenting for evaluation of cough, congestion, chills, body aches and fever with T-max 102 that started Sunday morning.  Reports fever max was yesterday.  States has not taken any over-the-counter medications today for the same complaints.  Has been taking over-the-counter Mucinex and Aleve.  States he thought he was doing better and decided to go to work today, but reports on the way to work he felt tired so he left and decided to come here for evaluation.  Reports continues to drink fluids well, slight decrease in appetite.  Reports multiple sick contacts at home including influenza sick contacts currently at home.  Denies sore throat. Denies other aggravating factors.  Reports otherwise feels well. Denies chest pain, shortness of breath, abdominal pain, or rash. Denies recent sickness. Denies recent antibiotic use.   Dione Housekeeper, MD: PCP   Past Medical History:  Diagnosis Date  . Hypercholesteremia   . Kidney stone 2012  . Wears glasses     Patient Active Problem List   Diagnosis Date Noted  . Kidney stones     Past Surgical History:  Procedure Laterality Date  . CLEFT PALATE REPAIR Left infant  . MENISCUS REPAIR Right   . ORCHIECTOMY Right      No current facility-administered medications for this encounter.   Current Outpatient Medications:  .  naproxen sodium (ALEVE) 220 MG tablet, Take 220 mg by mouth 2 (two) times daily as needed., Disp: , Rfl:  .  Omega-3 Fatty Acids (FISH OIL) 1200 MG CAPS, Take 1 capsule by mouth daily., Disp: , Rfl:  .  atorvastatin (LIPITOR) 40 MG tablet, Take 40 mg by mouth daily., Disp: , Rfl:   Allergies Patient has no known allergies.  Family History    Problem Relation Age of Onset  . Ulcers Mother   . Cancer Mother   . Emphysema Father     Social History Social History   Tobacco Use  . Smoking status: Never Smoker  . Smokeless tobacco: Never Used  Substance Use Topics  . Alcohol use: Yes    Comment: socially once monthly  . Drug use: No    Review of Systems Constitutional: As above.  ENT: No sore throat. Cardiovascular: Denies chest pain. Respiratory: Denies shortness of breath. Gastrointestinal: No abdominal pain.   Musculoskeletal: Negative for back pain. Skin: Negative for rash.  ____________________________________________   PHYSICAL EXAM:  VITAL SIGNS: ED Triage Vitals  Enc Vitals Group     BP 07/27/17 0831 132/78     Pulse Rate 07/27/17 0831 (!) 105     Resp 07/27/17 0831 16     Temp 07/27/17 0831 98.8 F (37.1 C)     Temp Source 07/27/17 0831 Oral     SpO2 07/27/17 0831 100 %     Weight 07/27/17 0831 140 lb (63.5 kg)     Height 07/27/17 0831 5\' 6"  (1.676 m)     Head Circumference --      Peak Flow --      Pain Score 07/27/17 0830 0     Pain Loc --      Pain Edu? --      Excl. in GC? --     Constitutional: Alert and oriented. Well appearing and in no  acute distress. Eyes: Conjunctivae are normal.  Head: Atraumatic. No sinus tenderness to palpation. No swelling. No erythema.  Ears: no erythema, normal TMs bilaterally.   Nose:Nasal congestion  Mouth/Throat: Mucous membranes are moist. No pharyngeal erythema. No tonsillar swelling or exudate.  Neck: No stridor.  No cervical spine tenderness to palpation. Hematological/Lymphatic/Immunilogical: No cervical lymphadenopathy. Cardiovascular: Normal rate, regular rhythm. Grossly normal heart sounds.  Good peripheral circulation. Respiratory: Normal respiratory effort.  No retractions. No wheezes, rales or rhonchi. Good air movement.  Musculoskeletal: Ambulatory with steady gait. No cervical, thoracic or lumbar tenderness to palpation. Neurologic:   Normal speech and language. No gait instability. Skin:  Skin appears warm, dry and intact. No rash noted. Psychiatric: Mood and affect are normal. Speech and behavior are normal.  ___________________________________________   LABS (all labs ordered are listed, but only abnormal results are displayed)  Labs Reviewed - No data to display ____________________________________________   PROCEDURES Procedures   INITIAL IMPRESSION / ASSESSMENT AND PLAN / ED COURSE  Pertinent labs & imaging results that were available during my care of the patient were reviewed by me and considered in my medical decision making (see chart for details).  Well-appearing patient.  No acute distress.  Suspect influenza.  Patient greater than 72 hours of symptoms, no Tamiflu given.  Patient declined need for cough medication.  Encourage supportive care, over-the-counter Tylenol ibuprofen as needed, rest and fluids.  Work note given for today and tomorrow.  Discussed follow up with Primary care physician this week. Discussed follow up and return parameters including no resolution or any worsening concerns. Patient verbalized understanding and agreed to plan.   ____________________________________________   FINAL CLINICAL IMPRESSION(S) / ED DIAGNOSES  Final diagnoses:  Influenza-like illness     ED Discharge Orders    None       Note: This dictation was prepared with Dragon dictation along with smaller phrase technology. Any transcriptional errors that result from this process are unintentional.         Renford DillsMiller, Vienne Corcoran, NP 07/27/17 1004

## 2017-07-27 NOTE — Discharge Instructions (Signed)
Rest. Drink plenty of fluids.  ° °Follow up with your primary care physician this week as needed. Return to Urgent care for new or worsening concerns.  ° °

## 2017-07-27 NOTE — ED Triage Notes (Signed)
Patient in today c/o fever (102), body aches, cough x 3 days. Patient has tried OTC Aleve, Mucinex.

## 2018-11-30 ENCOUNTER — Ambulatory Visit: Payer: Self-pay | Admitting: General Surgery

## 2018-11-30 NOTE — H&P (View-Only) (Signed)
PATIENT PROFILE: Kathrynn DuckingBenjamin Sanborn is a 58 y.o. male who presents to the Clinic for consultation at the request of Dr. Zada Finderslmedo for evaluation of umbilical hernia.  PCP:  Dione Housekeeperlmedo, Mario Ernesto, MD  HISTORY OF PRESENT ILLNESS: Mr. Dayton ScrapeMurray reports feeling umbilical hernia since couple of years ago.  Initially it was asymptomatic but in the past few weeks he has been feeling pain in the umbilical area.  The pain radiates a little bit cephalad to the umbilical area.  Pain does not radiate to other part of the body.  Aggravating factors are her diuretic it is umbilical area or pressure.  There is no alleviating factor.  There is no abdominal distention, there is no nausea or vomiting.  Patient denies fever chills.   PROBLEM LIST: Problem List  Date Reviewed: 11/20/2018         Noted   Cyst of medial meniscus 11/20/2018   Degeneration of lumbosacral intervertebral disc 11/20/2018   Derangement of posterior horn of medial meniscus 11/20/2018   Knee pain 11/20/2018   Lumbosacral spondylosis without myelopathy 11/20/2018   Tear of medial meniscus of knee 11/20/2018   Hypercholesteremia 07/26/2013   Kidney stones Unknown   Leukopenia Unknown   Chronic back pain Unknown   DDD (degenerative disc disease) Unknown   Cleft palate Unknown      GENERAL REVIEW OF SYSTEMS:   General ROS: negative for - chills, fatigue, fever, weight gain or weight loss Allergy and Immunology ROS: negative for - hives  Hematological and Lymphatic ROS: negative for - bleeding problems or bruising, negative for palpable nodes Endocrine ROS: negative for - heat or cold intolerance, hair changes Respiratory ROS: negative for - cough, shortness of breath or wheezing Cardiovascular ROS: no chest pain or palpitations GI ROS: negative for nausea, vomiting, abdominal pain, diarrhea, constipation Musculoskeletal ROS: Positive for joint swelling and muscle pain Neurological ROS: negative for - confusion, syncope Dermatological ROS:  negative for pruritus and rash Psychiatric: negative for anxiety, depression, difficulty sleeping and memory loss  MEDICATIONS: Current Outpatient Medications  Medication Sig Dispense Refill  . docosahexaenoic acid/epa (FISH OIL ORAL) Take by mouth    . naproxen sodium (ALEVE) 220 MG tablet Take 220 mg by mouth 2 (two) times daily as needed for Pain     No current facility-administered medications for this visit.     ALLERGIES: Patient has no known allergies.  PAST MEDICAL HISTORY: Past Medical History:  Diagnosis Date  . Chronic back pain   . Cleft palate   . DDD (degenerative disc disease)   . Kidney stones   . Leukopenia     PAST SURGICAL HISTORY: Past Surgical History:  Procedure Laterality Date  . Hand surgery Bilateral    Correction webbed fingers  . LAPAROSCOPIC ORCHIOECTOMY Right   . REDUCTION TESTICULAR TORSION Right      FAMILY HISTORY: Family History  Problem Relation Age of Onset  . Coronary Artery Disease (Blocked arteries around heart) Mother   . Hyperlipidemia (Elevated cholesterol) Mother   . High blood pressure (Hypertension) Mother   . Osteoarthritis Mother   . Alcohol abuse Mother   . Cancer Mother   . Thyroid disease Mother        Secondary toRadiation for oral cancer  . Alcohol abuse Father   . Emphysema Father   . No Known Problems Sister   . No Known Problems Son   . Glaucoma Maternal Grandmother   . Diabetes type II Maternal Grandfather   . Colon polyps Paternal  Grandmother   . Depression Paternal Grandfather      SOCIAL HISTORY: Social History   Socioeconomic History  . Marital status: Single    Spouse name: Not on file  . Number of children: 1  . Years of education: Not on file  . Highest education level: Not on file  Occupational History  . Occupation: Editor, commissioning  Social Needs  . Financial resource strain: Not on file  . Food insecurity:    Worry: Not on file    Inability: Not on file  . Transportation needs:     Medical: Not on file    Non-medical: Not on file  Tobacco Use  . Smoking status: Never Smoker  . Smokeless tobacco: Never Used  Substance and Sexual Activity  . Alcohol use: Yes    Alcohol/week: 1.8 - 3.7 standard drinks    Types: 1 - 2 Cans of beer, 1 - 2 Standard drinks or equivalent per week    Comment: pt states 1-2 a week if that of either.  . Drug use: No  . Sexual activity: Not Currently    Partners: Female  Other Topics Concern  . Not on file  Social History Narrative   The patient lives alone, he endorses the use of alcoholic beverages, no tobacco.    PHYSICAL EXAM: Vitals:   11/30/18 1522  BP: 133/81  Pulse: 76   Body mass index is 24.81 kg/m. Weight: 66.6 kg (146 lb 12.8 oz)   GENERAL: Alert, active, oriented x3  HEENT: Pupils equal reactive to light. Extraocular movements are intact. Sclera clear. Palpebral conjunctiva normal red color.Pharynx clear.  NECK: Supple with no palpable mass and no adenopathy.  LUNGS: Sound clear with no rales rhonchi or wheezes.  HEART: Regular rhythm S1 and S2 without murmur.  ABDOMEN: Soft and depressible, nontender with no palpable mass, no hepatomegaly.  Umbilical hernia, tender to palpation, reducible.  EXTREMITIES: Well-developed well-nourished symmetrical with no dependent edema.  NEUROLOGICAL: Awake alert oriented, facial expression symmetrical, moving all extremities.  REVIEW OF DATA: I have reviewed the following data today: No visits with results within 3 Month(s) from this visit.  Latest known visit with results is:  Office Visit on 01/29/2014  Component Date Value  . Cholesterol, Total 01/29/2014 248   . LDL Calculated 01/29/2014 159   . HDL 01/29/2014 51   . Triglyceride 01/29/2014 192   . Hepatitis C Virus Antibo* 01/29/2014 NonReactive   . WBC (White Blood Cell Co* 01/29/2014 4.1   . POC Hemoglobin 01/29/2014 15.0   . Hematocrit 01/29/2014 0.43   . Plt (platelets) 01/29/2014 276   . MCV (Mean  Corpuscular Vo* 01/29/2014 92   . MCH (Mean Corpuscular He* 01/29/2014 31.7   . MCHC (Mean Corpuscular H* 01/29/2014 34.6   . RBC (Red Blood Cell Coun* 01/29/2014 4.73   . RDW-CV (Red Cell Distrib* 01/29/2014 12.4   . NRBC (Nucleated Red Bloo* 01/29/2014 0.00   . NRBC % (Nucleated Red Bl* 01/29/2014 0.0   . MPV (Mean Platelet Volum* 01/29/2014 10.2   . Neutrophils 01/29/2014 2.4   . Neutrophil % 01/29/2014 57.9   . Lymphocyte Count 01/29/2014 1.2   . Lymphocyte % 01/29/2014 29.5   . Monocyte Count 01/29/2014 0.4   . Monocyte % 01/29/2014 9.9   . Eosinophils 01/29/2014 0.09   . Eosinophil % 01/29/2014 2.2   . Basophils 01/29/2014 0.02   . Basophil% 01/29/2014 0.5      ASSESSMENT: Mr. Schedler is a 58 y.o. male  presenting for consultation for umbilical hernia.    The patient presents with a symptomatic, reducible umbilical hernia. Patient was oriented about the diagnosis of umbilical hernia and its implication. The patient was oriented about the treatment alternatives (observation vs surgical repair). Due to patient symptoms, repair is recommended. Patient oriented about the surgical procedure, the use of mesh and its risk of complications such as: infection, bleeding, injury to vasculature, injury to bowel or bladder, and chronic pain.   Umbilical hernia without obstruction and without gangrene [K42.9]  PLAN: 1.  Umbilical hernia repair (49585) 2. CBC, CMP 3. Avoid taking aspirin 5 days before surgery 4. Contact us if has any question or concern.   Patient verbalized understanding, all questions were answered, and were agreeable with the plan outlined above.    Miamor Ayler Cintron-Diaz, MD  Electronically signed by Aubreana Cornacchia Cintron-Diaz, MD 

## 2018-11-30 NOTE — H&P (Signed)
PATIENT PROFILE: Donald DuckingBenjamin Velasquez is a 58 y.o. male who presents to the Clinic for consultation at the request of Dr. Zada Finderslmedo for evaluation of umbilical hernia.  PCP:  Dione Housekeeperlmedo, Mario Ernesto, MD  HISTORY OF PRESENT ILLNESS: Donald Velasquez reports feeling umbilical hernia since couple of years ago.  Initially it was asymptomatic but in the past few weeks he has been feeling pain in the umbilical area.  The pain radiates a little bit cephalad to the umbilical area.  Pain does not radiate to other part of the body.  Aggravating factors are her diuretic it is umbilical area or pressure.  There is no alleviating factor.  There is no abdominal distention, there is no nausea or vomiting.  Patient denies fever chills.   PROBLEM LIST: Problem List  Date Reviewed: 11/20/2018         Noted   Cyst of medial meniscus 11/20/2018   Degeneration of lumbosacral intervertebral disc 11/20/2018   Derangement of posterior horn of medial meniscus 11/20/2018   Knee pain 11/20/2018   Lumbosacral spondylosis without myelopathy 11/20/2018   Tear of medial meniscus of knee 11/20/2018   Hypercholesteremia 07/26/2013   Kidney stones Unknown   Leukopenia Unknown   Chronic back pain Unknown   DDD (degenerative disc disease) Unknown   Cleft palate Unknown      GENERAL REVIEW OF SYSTEMS:   General ROS: negative for - chills, fatigue, fever, weight gain or weight loss Allergy and Immunology ROS: negative for - hives  Hematological and Lymphatic ROS: negative for - bleeding problems or bruising, negative for palpable nodes Endocrine ROS: negative for - heat or cold intolerance, hair changes Respiratory ROS: negative for - cough, shortness of breath or wheezing Cardiovascular ROS: no chest pain or palpitations GI ROS: negative for nausea, vomiting, abdominal pain, diarrhea, constipation Musculoskeletal ROS: Positive for joint swelling and muscle pain Neurological ROS: negative for - confusion, syncope Dermatological ROS:  negative for pruritus and rash Psychiatric: negative for anxiety, depression, difficulty sleeping and memory loss  MEDICATIONS: Current Outpatient Medications  Medication Sig Dispense Refill  . docosahexaenoic acid/epa (FISH OIL ORAL) Take by mouth    . naproxen sodium (ALEVE) 220 MG tablet Take 220 mg by mouth 2 (two) times daily as needed for Pain     No current facility-administered medications for this visit.     ALLERGIES: Patient has no known allergies.  PAST MEDICAL HISTORY: Past Medical History:  Diagnosis Date  . Chronic back pain   . Cleft palate   . DDD (degenerative disc disease)   . Kidney stones   . Leukopenia     PAST SURGICAL HISTORY: Past Surgical History:  Procedure Laterality Date  . Hand surgery Bilateral    Correction webbed fingers  . LAPAROSCOPIC ORCHIOECTOMY Right   . REDUCTION TESTICULAR TORSION Right      FAMILY HISTORY: Family History  Problem Relation Age of Onset  . Coronary Artery Disease (Blocked arteries around heart) Mother   . Hyperlipidemia (Elevated cholesterol) Mother   . High blood pressure (Hypertension) Mother   . Osteoarthritis Mother   . Alcohol abuse Mother   . Cancer Mother   . Thyroid disease Mother        Secondary toRadiation for oral cancer  . Alcohol abuse Father   . Emphysema Father   . No Known Problems Sister   . No Known Problems Son   . Glaucoma Maternal Grandmother   . Diabetes type II Maternal Grandfather   . Colon polyps Paternal  Grandmother   . Depression Paternal Grandfather      SOCIAL HISTORY: Social History   Socioeconomic History  . Marital status: Single    Spouse name: Not on file  . Number of children: 1  . Years of education: Not on file  . Highest education level: Not on file  Occupational History  . Occupation: Editor, commissioning  Social Needs  . Financial resource strain: Not on file  . Food insecurity:    Worry: Not on file    Inability: Not on file  . Transportation needs:     Medical: Not on file    Non-medical: Not on file  Tobacco Use  . Smoking status: Never Smoker  . Smokeless tobacco: Never Used  Substance and Sexual Activity  . Alcohol use: Yes    Alcohol/week: 1.8 - 3.7 standard drinks    Types: 1 - 2 Cans of beer, 1 - 2 Standard drinks or equivalent per week    Comment: pt states 1-2 a week if that of either.  . Drug use: No  . Sexual activity: Not Currently    Partners: Female  Other Topics Concern  . Not on file  Social History Narrative   The patient lives alone, he endorses the use of alcoholic beverages, no tobacco.    PHYSICAL EXAM: Vitals:   11/30/18 1522  BP: 133/81  Pulse: 76   Body mass index is 24.81 kg/m. Weight: 66.6 kg (146 lb 12.8 oz)   GENERAL: Alert, active, oriented x3  HEENT: Pupils equal reactive to light. Extraocular movements are intact. Sclera clear. Palpebral conjunctiva normal red color.Pharynx clear.  NECK: Supple with no palpable mass and no adenopathy.  LUNGS: Sound clear with no rales rhonchi or wheezes.  HEART: Regular rhythm S1 and S2 without murmur.  ABDOMEN: Soft and depressible, nontender with no palpable mass, no hepatomegaly.  Umbilical hernia, tender to palpation, reducible.  EXTREMITIES: Well-developed well-nourished symmetrical with no dependent edema.  NEUROLOGICAL: Awake alert oriented, facial expression symmetrical, moving all extremities.  REVIEW OF DATA: I have reviewed the following data today: No visits with results within 3 Month(s) from this visit.  Latest known visit with results is:  Office Visit on 01/29/2014  Component Date Value  . Cholesterol, Total 01/29/2014 248   . LDL Calculated 01/29/2014 159   . HDL 01/29/2014 51   . Triglyceride 01/29/2014 192   . Hepatitis C Virus Antibo* 01/29/2014 NonReactive   . WBC (White Blood Cell Co* 01/29/2014 4.1   . POC Hemoglobin 01/29/2014 15.0   . Hematocrit 01/29/2014 0.43   . Plt (platelets) 01/29/2014 276   . MCV (Mean  Corpuscular Vo* 01/29/2014 92   . MCH (Mean Corpuscular He* 01/29/2014 31.7   . MCHC (Mean Corpuscular H* 01/29/2014 34.6   . RBC (Red Blood Cell Coun* 01/29/2014 4.73   . RDW-CV (Red Cell Distrib* 01/29/2014 12.4   . NRBC (Nucleated Red Bloo* 01/29/2014 0.00   . NRBC % (Nucleated Red Bl* 01/29/2014 0.0   . MPV (Mean Platelet Volum* 01/29/2014 10.2   . Neutrophils 01/29/2014 2.4   . Neutrophil % 01/29/2014 57.9   . Lymphocyte Count 01/29/2014 1.2   . Lymphocyte % 01/29/2014 29.5   . Monocyte Count 01/29/2014 0.4   . Monocyte % 01/29/2014 9.9   . Eosinophils 01/29/2014 0.09   . Eosinophil % 01/29/2014 2.2   . Basophils 01/29/2014 0.02   . Basophil% 01/29/2014 0.5      ASSESSMENT: Donald Velasquez is a 58 y.o. male  presenting for consultation for umbilical hernia.    The patient presents with a symptomatic, reducible umbilical hernia. Patient was oriented about the diagnosis of umbilical hernia and its implication. The patient was oriented about the treatment alternatives (observation vs surgical repair). Due to patient symptoms, repair is recommended. Patient oriented about the surgical procedure, the use of mesh and its risk of complications such as: infection, bleeding, injury to vasculature, injury to bowel or bladder, and chronic pain.   Umbilical hernia without obstruction and without gangrene [K42.9]  PLAN: 1.  Umbilical hernia repair [phone]) 2. CBC, CMP 3. Avoid taking aspirin 5 days before surgery 4. Contact us if has any question or concern.   Patient verbalized understanding, all questions were answered, and were agreeable with the plan outlined above.    Carolan ShiverEdgardo Cintron-Diaz, MD  Electronically signed by Carolan ShiverEdgardo Cintron-Diaz, MD

## 2018-12-11 ENCOUNTER — Encounter
Admission: RE | Admit: 2018-12-11 | Discharge: 2018-12-11 | Disposition: A | Discharge status: Home / Self Care | Payer: BC Managed Care – PPO | Source: Ambulatory Visit | Attending: General Surgery | Admitting: General Surgery

## 2018-12-11 ENCOUNTER — Other Ambulatory Visit: Payer: Self-pay

## 2018-12-11 NOTE — Patient Instructions (Signed)
Your procedure is scheduled on: 12/25/18 Report to Day Surgery. [facility]OND [facility]LOOR To find out your arrival time please call [phone] between 1PM - 3PM on 12/22/18.  Remember: Instructions that are not followed completely may result in serious medical risk,  up to and including death, or upon the discretion of your surgeon and anesthesiologist your  surgery may need to be rescheduled.     _X__ 1. Do not eat food after midnight the night before your procedure.                 No gum chewing or hard candies. You may drink clear liquids up to 2 hours                 before you are scheduled to arrive for your surgery- DO not drink clear                 liquids within 2 hours of the start of your surgery.                 Clear Liquids include:  water, apple juice without pulp, clear carbohydrate                 drink such as Clearfast of Gatorade, Black Coffee or Tea (Do not add                 anything to coffee or tea).  __X__2.  On the morning of surgery brush your teeth with toothpaste and water, you                may rinse your mouth with mouthwash if you wish.  Do not swallow any toothpaste of mouthwash.     _X__ 3.  No Alcohol for 24 hours before or after surgery.   _X__ 4.  Do Not Smoke or use e-cigarettes For 24 Hours Prior to Your Surgery.                 Do not use any chewable tobacco products for at least 6 hours prior to                 surgery.  ____  5.  Bring all medications with you on the day of surgery if instructed.   _X___  6.  Notify your doctor if there is any change in your medical condition      (cold, fever, infections).     Do not wear jewelry, make-up, hairpins, clips or nail polish. Do not wear lotions, powders, or perfumes. You may wear deodorant. Do not shave 48 hours prior to surgery. Men may shave face and neck. Do not bring valuables to the hospital.    [facility] is not responsible for any belongings or  valuables.  Contacts, dentures or bridgework may not be worn into surgery. Leave your suitcase in the car. After surgery it may be brought to your room. For patients admitted to the hospital, discharge time is determined by your treatment team.   Patients discharged the day of surgery will not be allowed to drive home.   Please read over the following fact sheets that you were given:   Surgical Site Infection Prevention          ____ Take these medicines the morning of surgery with A SIP OF WATER:    1. NONE  2.   3.   4.  5.  6.  ____ Fleet Enema (as directed)  __X__ Use CHG Soap as directed  ____ Use inhalers on the day of surgery  ____ Stop metformin 2 days prior to surgery    ____ Take 1/2 of usual insulin dose the night before surgery. No insulin the morning          of surgery.   __X__ Stop Coumadin/Plavix/aspirin on  NO ASPIRIN FOR 5 DAYS BEFORE SURGERY __X__ Stop Anti-inflammatories on    STOP ALEVE 1 WEEK BEFORE SURGERY   __X__ Stop supplements until after surgery.   STOP FISH OIL 10 DAYS PRIOR TO SURGERY  ____ Bring C-Pap to the hospital.

## 2018-12-14 ENCOUNTER — Other Ambulatory Visit: Payer: BC Managed Care – PPO

## 2018-12-21 ENCOUNTER — Other Ambulatory Visit: Payer: Self-pay

## 2018-12-21 ENCOUNTER — Other Ambulatory Visit
Admission: RE | Admit: 2018-12-21 | Discharge: 2018-12-21 | Disposition: A | Discharge status: Home / Self Care | Payer: BC Managed Care – PPO | Source: Ambulatory Visit | Attending: General Surgery | Admitting: General Surgery

## 2018-12-21 DIAGNOSIS — Z01812 Encounter for preprocedural laboratory examination: Secondary | ICD-10-CM | POA: Diagnosis present

## 2018-12-21 DIAGNOSIS — Z20828 Contact with and (suspected) exposure to other viral communicable diseases: Secondary | ICD-10-CM | POA: Diagnosis not present

## 2018-12-21 LAB — SARS CORONAVIRUS 2 (TAT 6-24 HRS): SARS Coronavirus 2: NEGATIVE

## 2018-12-24 MED ORDER — CEFAZOLIN SODIUM-DEXTROSE 2-4 GM/100ML-% IV SOLN
2.0000 g | INTRAVENOUS | Status: AC
  Administered 2018-12-25: 2 g via INTRAVENOUS

## 2018-12-25 ENCOUNTER — Encounter
Admission: RE | Disposition: A | Discharge status: Home / Self Care | Payer: Self-pay | Source: Home / Self Care | Attending: General Surgery

## 2018-12-25 ENCOUNTER — Ambulatory Visit
Admission: RE | Admit: 2018-12-25 | Discharge: 2018-12-25 | Disposition: A | Discharge status: Home / Self Care | Payer: BC Managed Care – PPO | Attending: General Surgery | Admitting: General Surgery

## 2018-12-25 ENCOUNTER — Ambulatory Visit: Payer: BC Managed Care – PPO | Admitting: Anesthesiology

## 2018-12-25 ENCOUNTER — Encounter: Payer: Self-pay | Admitting: *Deleted

## 2018-12-25 ENCOUNTER — Other Ambulatory Visit: Payer: Self-pay

## 2018-12-25 DIAGNOSIS — K439 Ventral hernia without obstruction or gangrene: Secondary | ICD-10-CM | POA: Diagnosis not present

## 2018-12-25 DIAGNOSIS — K429 Umbilical hernia without obstruction or gangrene: Secondary | ICD-10-CM | POA: Diagnosis not present

## 2018-12-25 HISTORY — DX: Personal history of urinary calculi: Z87.442

## 2018-12-25 HISTORY — PX: UMBILICAL HERNIA REPAIR: SHX196

## 2018-12-25 SURGERY — REPAIR, HERNIA, UMBILICAL, ADULT
Anesthesia: General

## 2018-12-25 MED ORDER — HYDROCODONE-ACETAMINOPHEN 7.5-325 MG PO TABS
ORAL_TABLET | ORAL | Status: AC
  Filled 2018-12-25: qty 1

## 2018-12-25 MED ORDER — KETOROLAC TROMETHAMINE 30 MG/ML IJ SOLN
INTRAMUSCULAR | Status: DC | PRN
  Administered 2018-12-25: 30 mg via INTRAVENOUS

## 2018-12-25 MED ORDER — CEFAZOLIN SODIUM-DEXTROSE 2-4 GM/100ML-% IV SOLN
INTRAVENOUS | Status: AC
  Filled 2018-12-25: qty 100

## 2018-12-25 MED ORDER — ACETAMINOPHEN 10 MG/ML IV SOLN
INTRAVENOUS | Status: DC | PRN
  Administered 2018-12-25: 1000 mg via INTRAVENOUS

## 2018-12-25 MED ORDER — MIDAZOLAM HCL 2 MG/2ML IJ SOLN
INTRAMUSCULAR | Status: DC | PRN
  Administered 2018-12-25: 2 mg via INTRAVENOUS

## 2018-12-25 MED ORDER — FAMOTIDINE 20 MG PO TABS
ORAL_TABLET | ORAL | Status: AC
  Filled 2018-12-25: qty 1

## 2018-12-25 MED ORDER — PROPOFOL 10 MG/ML IV BOLUS
INTRAVENOUS | Status: AC
  Filled 2018-12-25: qty 20

## 2018-12-25 MED ORDER — BUPIVACAINE-EPINEPHRINE (PF) 0.5% -1:200000 IJ SOLN
INTRAMUSCULAR | Status: AC
  Filled 2018-12-25: qty 30

## 2018-12-25 MED ORDER — KETOROLAC TROMETHAMINE 30 MG/ML IJ SOLN: 30.0000 mg | Freq: Once | INTRAMUSCULAR | Status: DC | PRN

## 2018-12-25 MED ORDER — BUPIVACAINE-EPINEPHRINE (PF) 0.5% -1:200000 IJ SOLN
INTRAMUSCULAR | Status: DC | PRN
  Administered 2018-12-25: 6 mL via PERINEURAL

## 2018-12-25 MED ORDER — ACETAMINOPHEN 160 MG/5ML PO SOLN
325.0000 mg | ORAL | Status: DC | PRN
  Filled 2018-12-25: qty 20.3

## 2018-12-25 MED ORDER — HYDROCODONE-ACETAMINOPHEN 5-325 MG PO TABS: 1.0000 | ORAL_TABLET | ORAL | 0 refills | Status: AC | PRN

## 2018-12-25 MED ORDER — FENTANYL CITRATE (PF) 100 MCG/2ML IJ SOLN: INTRAMUSCULAR | Status: DC | PRN

## 2018-12-25 MED ORDER — ACETAMINOPHEN 325 MG PO TABS: 325.0000 mg | ORAL_TABLET | ORAL | Status: DC | PRN

## 2018-12-25 MED ORDER — MIDAZOLAM HCL 2 MG/2ML IJ SOLN
INTRAMUSCULAR | Status: AC
  Filled 2018-12-25: qty 2

## 2018-12-25 MED ORDER — LIDOCAINE HCL (PF) 2 % IJ SOLN
INTRAMUSCULAR | Status: AC
  Filled 2018-12-25: qty 10

## 2018-12-25 MED ORDER — ONDANSETRON HCL 4 MG/2ML IJ SOLN
INTRAMUSCULAR | Status: AC
  Filled 2018-12-25: qty 2

## 2018-12-25 MED ORDER — FENTANYL CITRATE (PF) 100 MCG/2ML IJ SOLN
INTRAMUSCULAR | Status: DC | PRN
  Administered 2018-12-25: 50 ug via INTRAVENOUS
  Administered 2018-12-25 (×2): 25 ug via INTRAVENOUS

## 2018-12-25 MED ORDER — LACTATED RINGERS IV SOLN
INTRAVENOUS | Status: DC
  Administered 2018-12-25: 14:00:00 via INTRAVENOUS

## 2018-12-25 MED ORDER — DEXMEDETOMIDINE HCL 200 MCG/2ML IV SOLN
INTRAVENOUS | Status: DC | PRN
  Administered 2018-12-25: 8 ug via INTRAVENOUS
  Administered 2018-12-25: 12 ug via INTRAVENOUS

## 2018-12-25 MED ORDER — MEPERIDINE HCL 50 MG/ML IJ SOLN: 6.2500 mg | INTRAMUSCULAR | Status: DC | PRN

## 2018-12-25 MED ORDER — FENTANYL CITRATE (PF) 100 MCG/2ML IJ SOLN
INTRAMUSCULAR | Status: AC
  Filled 2018-12-25: qty 2

## 2018-12-25 MED ORDER — FENTANYL CITRATE (PF) 100 MCG/2ML IJ SOLN: 25.0000 ug | INTRAMUSCULAR | Status: DC | PRN

## 2018-12-25 MED ORDER — DEXAMETHASONE SODIUM PHOSPHATE 10 MG/ML IJ SOLN
INTRAMUSCULAR | Status: AC
  Filled 2018-12-25: qty 1

## 2018-12-25 MED ORDER — PROPOFOL 10 MG/ML IV BOLUS
INTRAVENOUS | Status: DC | PRN
  Administered 2018-12-25: 170 mg via INTRAVENOUS

## 2018-12-25 MED ORDER — LIDOCAINE HCL (CARDIAC) PF 100 MG/5ML IV SOSY
PREFILLED_SYRINGE | INTRAVENOUS | Status: DC | PRN
  Administered 2018-12-25: 60 mg via INTRAVENOUS

## 2018-12-25 MED ORDER — DEXMEDETOMIDINE HCL IN NACL 80 MCG/20ML IV SOLN
INTRAVENOUS | Status: AC
  Filled 2018-12-25: qty 20

## 2018-12-25 MED ORDER — ONDANSETRON HCL 4 MG/2ML IJ SOLN
INTRAMUSCULAR | Status: DC | PRN
  Administered 2018-12-25: 4 mg via INTRAVENOUS

## 2018-12-25 MED ORDER — FAMOTIDINE 20 MG PO TABS
20.0000 mg | ORAL_TABLET | Freq: Once | ORAL | Status: AC
  Administered 2018-12-25: 20 mg via ORAL

## 2018-12-25 MED ORDER — ACETAMINOPHEN 10 MG/ML IV SOLN
INTRAVENOUS | Status: AC
  Filled 2018-12-25: qty 100

## 2018-12-25 MED ORDER — PROMETHAZINE HCL 25 MG/ML IJ SOLN: 6.2500 mg | INTRAMUSCULAR | Status: DC | PRN

## 2018-12-25 MED ORDER — HYDROCODONE-ACETAMINOPHEN 7.5-325 MG PO TABS
1.0000 | ORAL_TABLET | Freq: Once | ORAL | Status: AC | PRN
  Administered 2018-12-25: 1 via ORAL

## 2018-12-25 SURGICAL SUPPLY — 32 items
BLADE SURG 15 STRL LF DISP TIS (BLADE) ×1 IMPLANT
BLADE SURG 15 STRL SS (BLADE) ×2
CANISTER SUCT 1200ML W/VALVE (MISCELLANEOUS) ×3 IMPLANT
CHLORAPREP W/TINT 26 (MISCELLANEOUS) ×3 IMPLANT
COVER WAND RF STERILE (DRAPES) ×3 IMPLANT
DERMABOND ADVANCED (GAUZE/BANDAGES/DRESSINGS) ×2
DERMABOND ADVANCED .7 DNX12 (GAUZE/BANDAGES/DRESSINGS) ×1 IMPLANT
DRAPE LAPAROTOMY 77X122 PED (DRAPES) ×3 IMPLANT
ELECT REM PT RETURN 9FT ADLT (ELECTROSURGICAL) ×3
ELECTRODE REM PT RTRN 9FT ADLT (ELECTROSURGICAL) ×1 IMPLANT
GLOVE BIO SURGEON STRL SZ 6.5 (GLOVE) ×2 IMPLANT
GLOVE BIO SURGEONS STRL SZ 6.5 (GLOVE) ×1
GLOVE BIOGEL PI IND STRL 6.5 (GLOVE) ×1 IMPLANT
GLOVE BIOGEL PI INDICATOR 6.5 (GLOVE) ×2
GOWN STRL REUS W/ TWL LRG LVL3 (GOWN DISPOSABLE) ×3 IMPLANT
GOWN STRL REUS W/TWL LRG LVL3 (GOWN DISPOSABLE) ×6
KIT TURNOVER KIT A (KITS) ×3 IMPLANT
LABEL OR SOLS (LABEL) ×3 IMPLANT
NDL HYPO 25X1 1.5 SAFETY (NEEDLE) ×1 IMPLANT
NEEDLE HYPO 25X1 1.5 SAFETY (NEEDLE) ×3 IMPLANT
NS IRRIG 500ML POUR BTL (IV SOLUTION) ×3 IMPLANT
PACK BASIN MINOR ARMC (MISCELLANEOUS) ×3 IMPLANT
SUT ETHIBOND NAB MO 7 #0 18IN (SUTURE) ×2 IMPLANT
SUT MNCRL 4-0 (SUTURE) ×2
SUT MNCRL 4-0 27XMFL (SUTURE) ×1
SUT PDS AB 0 CT1 27 (SUTURE) ×3 IMPLANT
SUT PROLENE 0 CT 2 (SUTURE) ×6 IMPLANT
SUT VIC AB 0 CT1 36 (SUTURE) ×3 IMPLANT
SUT VIC AB 2-0 SH 27 (SUTURE) ×4
SUT VIC AB 2-0 SH 27XBRD (SUTURE) ×2 IMPLANT
SUTURE MNCRL 4-0 27XMF (SUTURE) ×1 IMPLANT
SYR 10ML LL (SYRINGE) ×3 IMPLANT

## 2018-12-25 NOTE — Transfer of Care (Signed)
Immediate Anesthesia Transfer of Care Note  Patient: Donald Velasquez.  Procedure(s) Performed: OPEN HERNIA REPAIR UMBILICAL ADULT (N/A )  Patient Location: PACU  Anesthesia Type:General  Level of Consciousness: sedated  Airway & Oxygen Therapy: Patient Spontanous Breathing and Patient connected to face mask oxygen  Post-op Assessment: Report given to RN and Post -op Vital signs reviewed and stable  Post vital signs: Reviewed and stable  Last Vitals:  Vitals Value Taken Time  BP 91/46 12/25/2018  Temp    Pulse 68   Resp 16   SpO2 98%     Last Pain:  Vitals:   12/25/18 1345  TempSrc: Tympanic  PainSc: 0-No pain         Complications: No apparent anesthesia complications

## 2018-12-25 NOTE — Op Note (Signed)
Preoperative diagnosis: Umbilical hernia.   Postoperative diagnosis: Umbilical hernia and ventral hernia   Procedure: Umbilical hernia repair                      Ventral hernia repair   Anesthesia: GETA   Surgeon: Dr. Windell Moment   Wound Classification: Clean   Indications: Patient is a 58 y.o. male developed a symptomatic umbilical hernia. This was symptomatic and repair was indicated.    Findings: 1. A 0.5 cm umbilical hernia  2. A 0.8 cm cephalad ventral hernia  3. No hollow viscus organ injury identified during procedure 4. Tension free repair achieved 5. Adequate hemostasis   Description of procedure: The patient was brought to the operating room and general anesthesia was induced. A time-out was completed verifying correct patient, procedure, site, positioning, and implant(s) and/or special equipment prior to beginning this procedure. Antibiotics were administered prior to making the incision. The anterior abdominal wall was prepped and draped in the standard sterile fashion. A supra umbilical incision was made. The incision was deepened to the fascia. A ventral hernia was identified cephalad to an umbilical hernia. Both hernias were less than 1 cm in their longest diameter. The hernia sac was then identified and dissected free. The pre peritoneal fat was able to be reduced. Ventral hernia fixed with two interrupted Ethibond 0 sutures. The umbilical hernia sac was also dissected from the umbilical stalk and able to be reduced easily. The fascia was carefully palpated no additional defects were identified. The hernia defect was closed with one Ethibond 0 suture on midline. The umbilical stalk was fixed to fascia with 2-0 Vicryl and the skin was closed with subcuticular sutures of Monocryl 4-0. Wound covered with Dermabond.   The patient tolerated the procedure well and was brought to the postanesthesia care unit in stable condition.    Specimen: None   Complications: None    Estimated Blood Loss: 3  mL

## 2018-12-25 NOTE — Interval H&P Note (Signed)
History and Physical Interval Note:  12/25/2018 2:52 PM  Donald Velasquez.  has presented today for surgery, with the diagnosis of Z61.0 UMBILICAL HERNIA W/O OBSTRUCTION OR GANGRENE.  The various methods of treatment have been discussed with the patient and family. After consideration of risks, benefits and other options for treatment, the patient has consented to  Procedure(s): OPEN HERNIA REPAIR UMBILICAL ADULT (N/A) as a surgical intervention.  The patient's history has been reviewed, patient examined, no change in status, stable for surgery.  I have reviewed the patient's chart and labs.  Questions were answered to the patient's satisfaction.     Herbert Pun

## 2018-12-25 NOTE — Anesthesia Post-op Follow-up Note (Signed)
Anesthesia QCDR form completed.        

## 2018-12-25 NOTE — Anesthesia Preprocedure Evaluation (Signed)
Anesthesia Evaluation  Patient identified by MRN, date of birth, ID band Patient awake    Reviewed: Allergy & Precautions, H&P , NPO status , reviewed documented beta blocker date and time   Airway Mallampati: II  TM Distance: >3 FB Neck ROM: full    Dental  (+) Chipped, Missing   Pulmonary    Pulmonary exam normal        Cardiovascular Normal cardiovascular exam     Neuro/Psych    GI/Hepatic neg GERD  ,  Endo/Other    Renal/GU Renal disease     Musculoskeletal   Abdominal   Peds  Hematology   Anesthesia Other Findings Past Medical History: No date: Hypercholesteremia 2012: Kidney stone No date: Wears glasses Past Surgical History: infant: CLEFT PALATE REPAIR; Left No date: MENISCUS REPAIR; Right No date: ORCHIECTOMY; Right BMI    LMA vs GOT as required by surgeon  Body Mass Index: 23.40 kg/m     Reproductive/Obstetrics                             Anesthesia Physical Anesthesia Plan  ASA: II  Anesthesia Plan: General ETT and General LMA   Post-op Pain Management:    Induction: Intravenous  PONV Risk Score and Plan: 3 and Midazolam, Ondansetron, Dexamethasone and Treatment may vary due to age or medical condition  Airway Management Planned: LMA and Oral ETT  Additional Equipment:   Intra-op Plan:   Post-operative Plan: Extubation in OR  Informed Consent: I have reviewed the patients History and Physical, chart, labs and discussed the procedure including the risks, benefits and alternatives for the proposed anesthesia with the patient or authorized representative who has indicated his/her understanding and acceptance.     Dental Advisory Given  Plan Discussed with: CRNA  Anesthesia Plan Comments:         Anesthesia Quick Evaluation

## 2018-12-25 NOTE — Anesthesia Procedure Notes (Signed)
Procedure Name: LMA Insertion Date/Time: 12/25/2018 3:02 PM Performed by: Dionne Bucy, CRNA Pre-anesthesia Checklist: Patient identified, Patient being monitored, Timeout performed, Emergency Drugs available and Suction available Patient Re-evaluated:Patient Re-evaluated prior to induction Oxygen Delivery Method: Circle system utilized Preoxygenation: Pre-oxygenation with 100% oxygen Induction Type: IV induction Ventilation: Mask ventilation without difficulty LMA: LMA inserted LMA Size: 4.5 Tube type: Oral Number of attempts: 1 Placement Confirmation: positive ETCO2 and breath sounds checked- equal and bilateral Tube secured with: Tape Dental Injury: Teeth and Oropharynx as per pre-operative assessment

## 2018-12-25 NOTE — Discharge Instructions (Addendum)
AMBULATORY SURGERY  DISCHARGE INSTRUCTIONS   1) The drugs that you were given will stay in your system until tomorrow so for the next 24 hours you should not:  A) Drive an automobile B) Make any legal decisions C) Drink any alcoholic beverage   2) You may resume regular meals tomorrow.  Today it is better to start with liquids and gradually work up to solid foods.  You may eat anything you prefer, but it is better to start with liquids, then soup and crackers, and gradually work up to solid foods.   3) Please notify your doctor immediately if you have any unusual bleeding, trouble breathing, redness and pain at the surgery site, drainage, fever, or pain not relieved by medication.    4) Additional Instructions:        Please contact your physician with any problems or Same Day Surgery at [phone], Monday through Friday 6 am to 4 pm, or [facility] [place] [facility] number at [phone]. Diet: Resume home heart healthy regular diet.   Activity: No heavy lifting >20 pounds (children, pets, laundry, garbage) or strenuous activity until follow-up, but light activity and walking are encouraged. Do not drive or drink alcohol if taking narcotic pain medications.  Wound care: May shower with soapy water and pat dry (do not rub incisions), but no baths or submerging incision underwater until follow-up. (no swimming)   Medications: Resume all home medications. For mild to moderate pain: acetaminophen (Tylenol) or ibuprofen (if no kidney disease). Combining Tylenol with alcohol can substantially increase your risk of causing liver disease. Narcotic pain medications, if prescribed, can be used for severe pain, though may cause nausea, constipation, and drowsiness. Do not combine Tylenol and Norco within a 6 hour period as Norco contains Tylenol. If you do not need the narcotic pain medication, you do not need to fill the prescription.  Call office ([phone]) at any time if any  questions, worsening pain, fevers/chills, bleeding, drainage from incision site, or other concerns.  

## 2018-12-25 NOTE — Anesthesia Postprocedure Evaluation (Signed)
Anesthesia Post Note  Patient: Donald Velasquez.  Procedure(s) Performed: OPEN HERNIA REPAIR UMBILICAL ADULT (N/A )  Patient location during evaluation: PACU Anesthesia Type: General Level of consciousness: awake and alert Pain management: pain level controlled Vital Signs Assessment: post-procedure vital signs reviewed and stable Respiratory status: spontaneous breathing, nonlabored ventilation, respiratory function stable and patient connected to nasal cannula oxygen Cardiovascular status: blood pressure returned to baseline and stable Postop Assessment: no apparent nausea or vomiting Anesthetic complications: no     Last Vitals:  Vitals:   12/25/18 1622 12/25/18 1637  BP: 111/68 115/73  Pulse: 74 78  Resp: 10 15  Temp:  (!) 36.2 C  SpO2: 98% 97%    Last Pain:  Vitals:   12/25/18 1622  TempSrc:   PainSc: 3                  Precious Haws Orval Dortch
